# Patient Record
Sex: Male | Born: 1960 | Race: Black or African American | Hispanic: No | Marital: Married | Smoking: Never smoker
Health system: Southern US, Community
[De-identification: ages and names within clinical notes are randomized; demographics above are authoritative.]

## PROBLEM LIST (undated history)

## (undated) DIAGNOSIS — I10 Essential (primary) hypertension: Secondary | ICD-10-CM

## (undated) DIAGNOSIS — E785 Hyperlipidemia, unspecified: Secondary | ICD-10-CM

## (undated) DIAGNOSIS — D860 Sarcoidosis of lung: Secondary | ICD-10-CM

## (undated) HISTORY — DX: Hyperlipidemia, unspecified: E78.5

## (undated) HISTORY — DX: Sarcoidosis of lung: D86.0

## (undated) HISTORY — DX: Essential (primary) hypertension: I10

## (undated) HISTORY — PX: BRONCHOSCOPY: SUR163

---

## 2002-12-29 ENCOUNTER — Encounter: Payer: Self-pay | Admitting: Internal Medicine

## 2004-02-04 ENCOUNTER — Encounter: Admission: RE | Admit: 2004-02-04 | Discharge: 2004-05-04 | Payer: Self-pay | Admitting: Internal Medicine

## 2005-05-18 ENCOUNTER — Ambulatory Visit: Payer: Self-pay | Admitting: Internal Medicine

## 2005-05-24 ENCOUNTER — Ambulatory Visit: Payer: Self-pay | Admitting: Internal Medicine

## 2005-09-07 ENCOUNTER — Encounter: Admission: RE | Admit: 2005-09-07 | Discharge: 2005-09-07 | Payer: Self-pay | Admitting: Internal Medicine

## 2005-09-07 ENCOUNTER — Encounter: Payer: Self-pay | Admitting: Pulmonary Disease

## 2005-09-07 ENCOUNTER — Ambulatory Visit: Payer: Self-pay | Admitting: Internal Medicine

## 2005-09-10 ENCOUNTER — Ambulatory Visit: Payer: Self-pay | Admitting: Pulmonary Disease

## 2005-09-19 ENCOUNTER — Encounter (INDEPENDENT_AMBULATORY_CARE_PROVIDER_SITE_OTHER): Payer: Self-pay | Admitting: Specialist

## 2005-09-19 ENCOUNTER — Ambulatory Visit: Admission: RE | Admit: 2005-09-19 | Discharge: 2005-09-19 | Payer: Self-pay | Admitting: Pulmonary Disease

## 2005-09-19 ENCOUNTER — Ambulatory Visit: Payer: Self-pay | Admitting: Pulmonary Disease

## 2005-09-24 ENCOUNTER — Ambulatory Visit: Payer: Self-pay | Admitting: Pulmonary Disease

## 2005-09-25 ENCOUNTER — Emergency Department (HOSPITAL_COMMUNITY): Admission: EM | Admit: 2005-09-25 | Discharge: 2005-09-26 | Payer: Self-pay | Admitting: Emergency Medicine

## 2005-10-24 ENCOUNTER — Ambulatory Visit: Payer: Self-pay | Admitting: Pulmonary Disease

## 2006-07-26 ENCOUNTER — Ambulatory Visit: Payer: Self-pay | Admitting: Internal Medicine

## 2006-09-25 ENCOUNTER — Ambulatory Visit: Payer: Self-pay | Admitting: Internal Medicine

## 2006-09-25 LAB — CONVERTED CEMR LAB
AST: 24 units/L (ref 0–37)
Alkaline Phosphatase: 83 units/L (ref 39–117)
Bilirubin, Direct: 0.1 mg/dL (ref 0.0–0.3)
CO2: 27 meq/L (ref 19–32)
Calcium: 9.3 mg/dL (ref 8.4–10.5)
Chloride: 107 meq/L (ref 96–112)
Cholesterol: 232 mg/dL (ref 0–200)
Creatinine, Ser: 1.1 mg/dL (ref 0.4–1.5)
Eosinophils Relative: 2.7 % (ref 0.0–5.0)
Glucose, Bld: 88 mg/dL (ref 70–99)
HCT: 43.2 % (ref 39.0–52.0)
HDL: 39 mg/dL (ref >39.0)
MCV: 79.7 fL (ref 78.0–100.0)
Monocytes Absolute: 0.5 10*3/uL (ref 0.2–0.7)
Monocytes Relative: 13.5 % — ABNORMAL HIGH (ref 3.0–11.0)
Neutro Abs: 2.5 10*3/uL (ref 1.4–7.7)
Neutrophils Relative %: 64.3 % (ref 43.0–77.0)
Potassium: 4 meq/L (ref 3.5–5.1)
Sodium: 142 meq/L (ref 135–145)
Total Protein: 8.2 g/dL (ref 6.0–8.3)
Triglycerides: 78 mg/dL (ref 0–149)
VLDL: 16 mg/dL (ref 0–40)

## 2006-10-01 ENCOUNTER — Ambulatory Visit: Payer: Self-pay | Admitting: Internal Medicine

## 2007-03-08 DIAGNOSIS — D869 Sarcoidosis, unspecified: Secondary | ICD-10-CM

## 2007-03-08 DIAGNOSIS — I10 Essential (primary) hypertension: Secondary | ICD-10-CM | POA: Insufficient documentation

## 2007-03-08 DIAGNOSIS — E785 Hyperlipidemia, unspecified: Secondary | ICD-10-CM | POA: Insufficient documentation

## 2007-04-22 ENCOUNTER — Ambulatory Visit: Payer: Self-pay | Admitting: Internal Medicine

## 2007-05-06 ENCOUNTER — Ambulatory Visit: Payer: Self-pay | Admitting: Internal Medicine

## 2007-05-06 ENCOUNTER — Telehealth: Payer: Self-pay | Admitting: *Deleted

## 2007-05-06 LAB — CONVERTED CEMR LAB
Basophils Absolute: 0 10*3/uL (ref 0.0–0.1)
Cholesterol, target level: 200 mg/dL
Direct LDL: 203 mg/dL
Eosinophils Absolute: 0.1 10*3/uL (ref 0.0–0.6)
HDL goal, serum: 40 mg/dL
HDL: 40.9 mg/dL (ref >39.0)
Hemoglobin: 15.2 g/dL (ref 13.0–17.0)
LDL Goal: 130 mg/dL
MCV: 83.6 fL (ref 78.0–100.0)
Monocytes Relative: 16.5 % — ABNORMAL HIGH (ref 3.0–11.0)
Neutro Abs: 2.2 10*3/uL (ref 1.4–7.7)
Platelets: 236 10*3/uL (ref 150–400)
RBC: 5.42 M/uL (ref 4.22–5.81)
RDW: 12.9 % (ref 11.5–14.6)
WBC: 3.8 10*3/uL — ABNORMAL LOW (ref 4.5–10.5)

## 2007-05-26 IMAGING — CT CT CHEST W/ CM
2 of 5 series · 15 of 31 positions shown, 18 images · IV contrast (75CC OMNI 300)
Comparison: AO-MA4 Chest x-ray, 09/07/05

CLINICAL DATA: Recurrent chronic cough. 
CT CHEST WITH CONTRAST:
TECHNIQUE: Multidetector CT imaging of the chest was performed following the standard protocol during bolus administration of intravenous contrast.
Contrast:  75 cc Omnipaque 300

[Series 4: recon 3: routine chest · axial · 0.70mm/px · z∈[-260,-11]mm · 11 of 245 slices shown, 14 images]
[im 23/245  mediastinal]
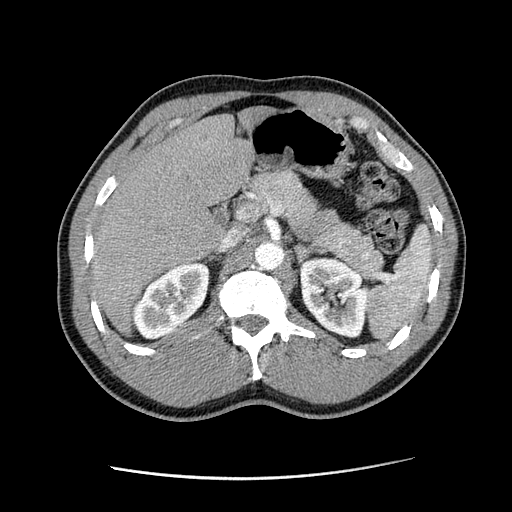
[im 23/245  lung]
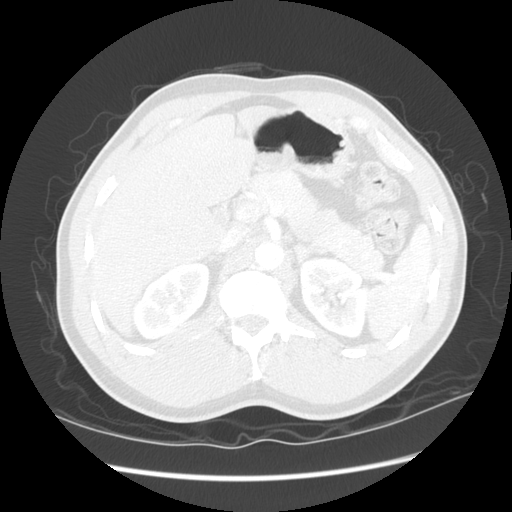
[im 45/245  lung]
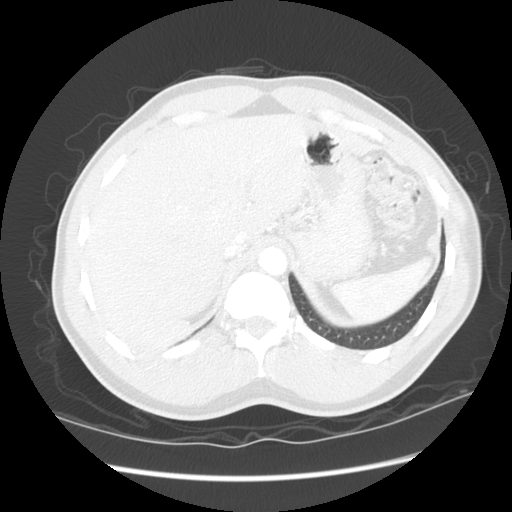
[im 67/245  lung]
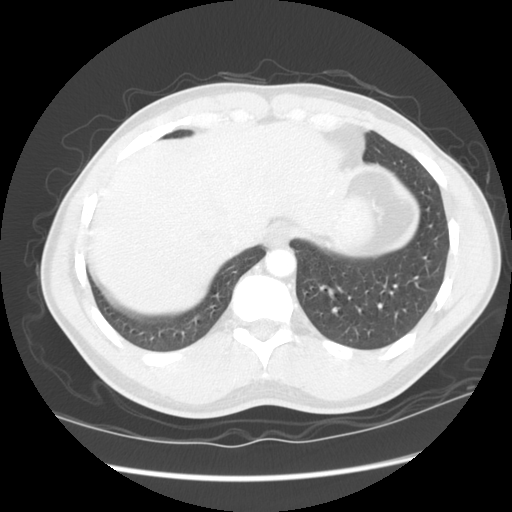
[im 89/245  lung]
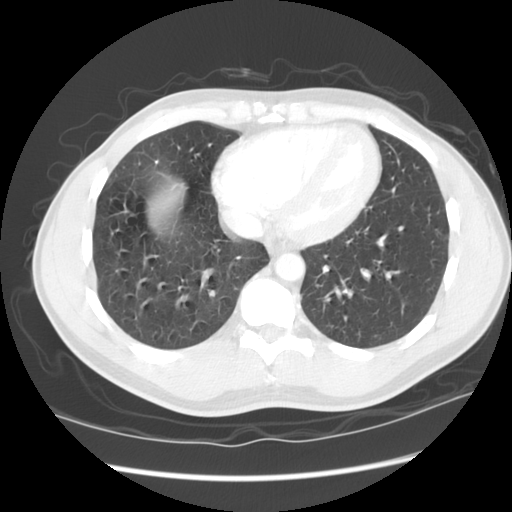
[im 111/245  mediastinal]
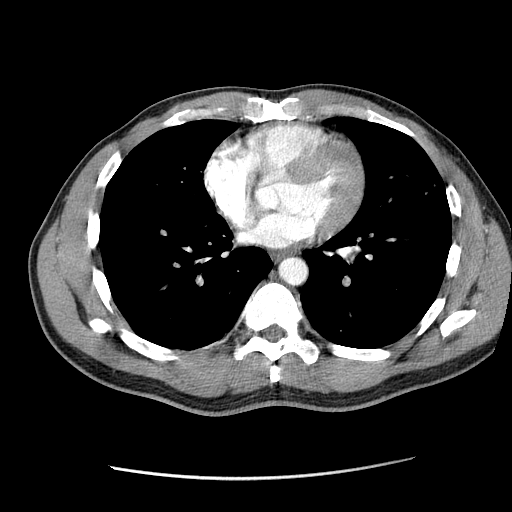
[im 111/245  lung]
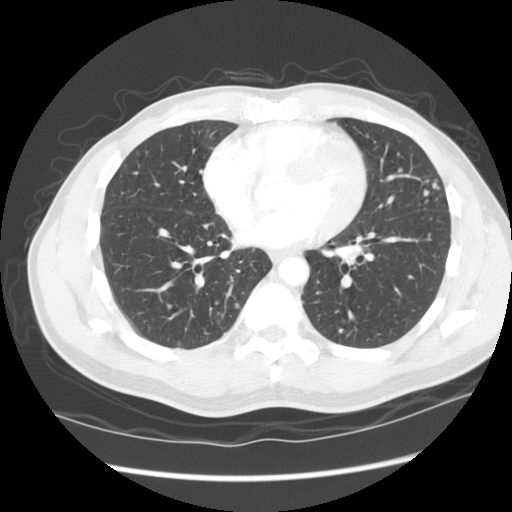
[im 126/245  lung]
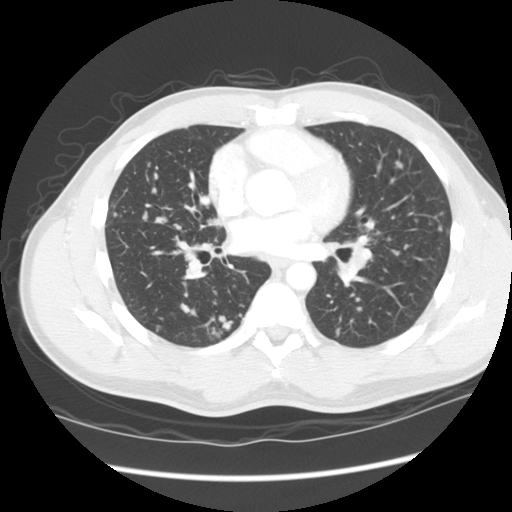
[im 134/245  lung]
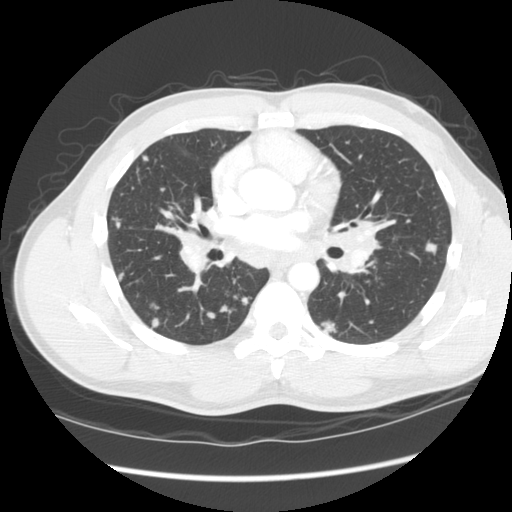
[im 156/245  lung]
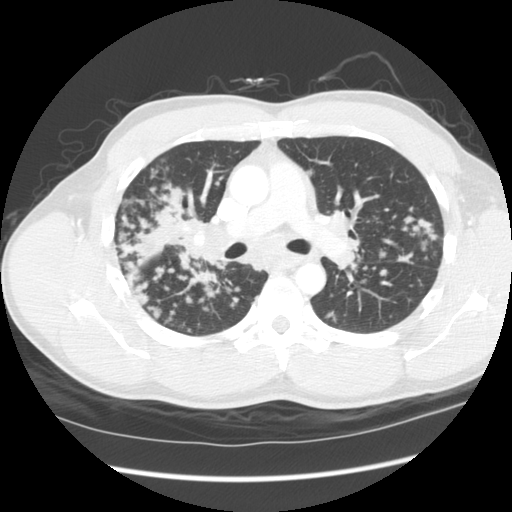
[im 178/245  mediastinal]
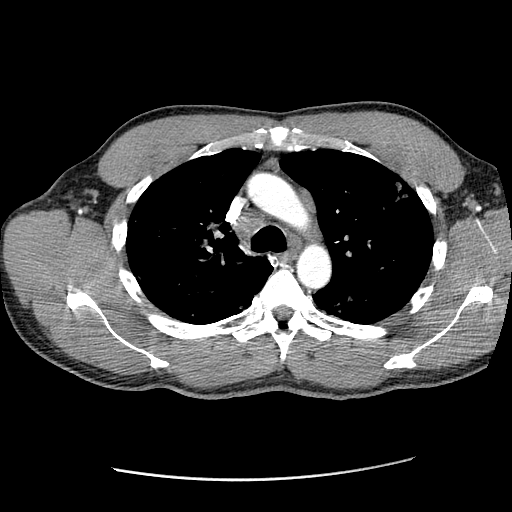
[im 178/245  lung]
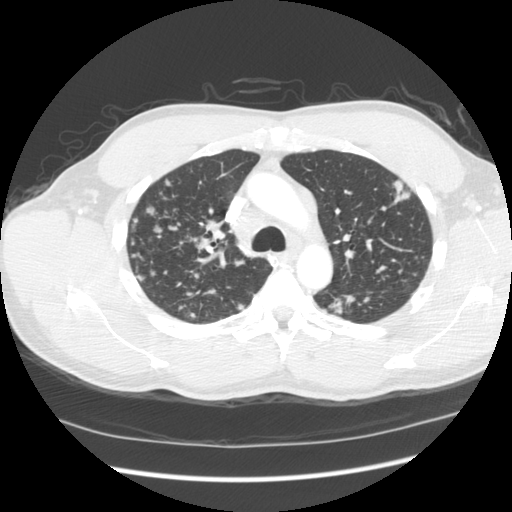
[im 200/245  lung]
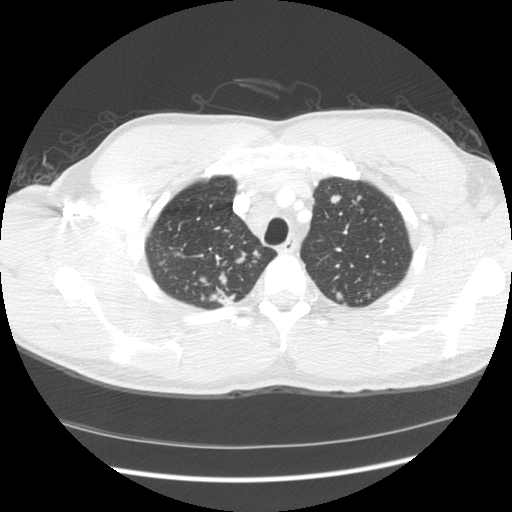
[im 222/245  lung]
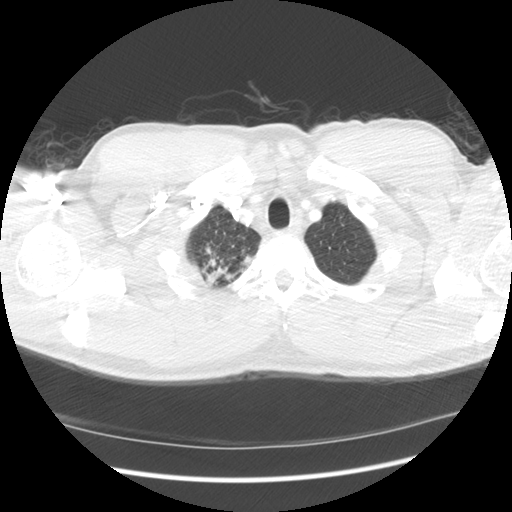

[Series 400: reformatted · sagittal · 0.70mm/px · 4 of 128 slices shown]
[im 26/128  lung]
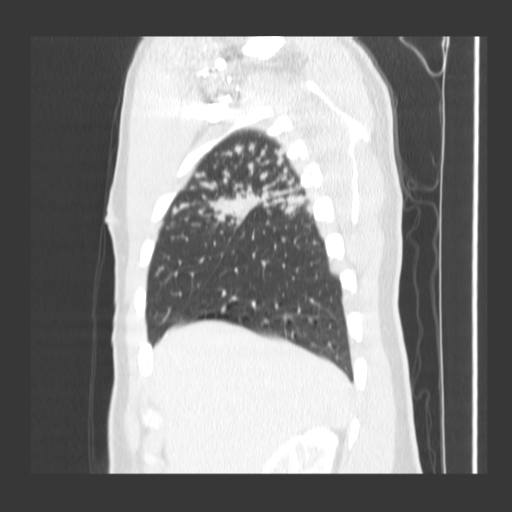
[im 51/128  lung]
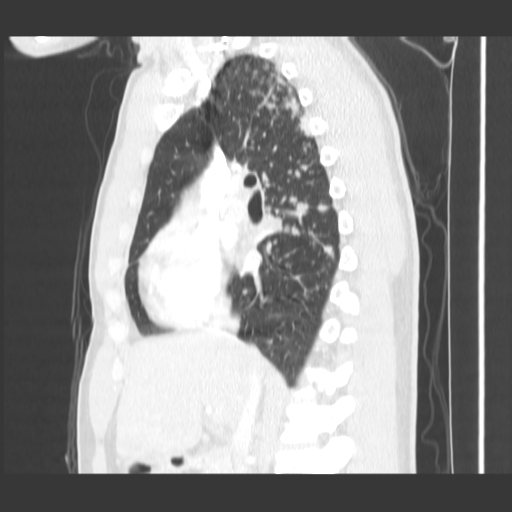
[im 77/128  lung]
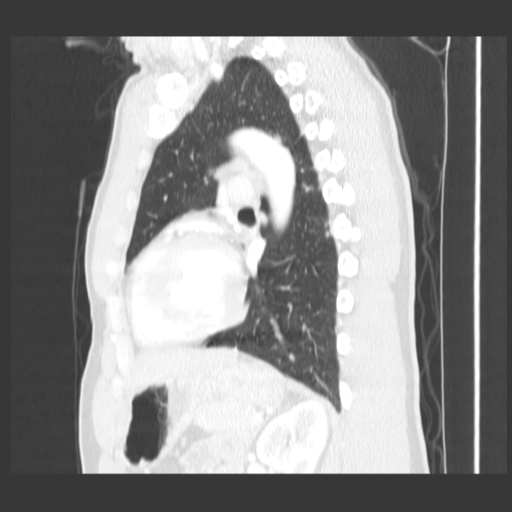
[im 102/128  lung]
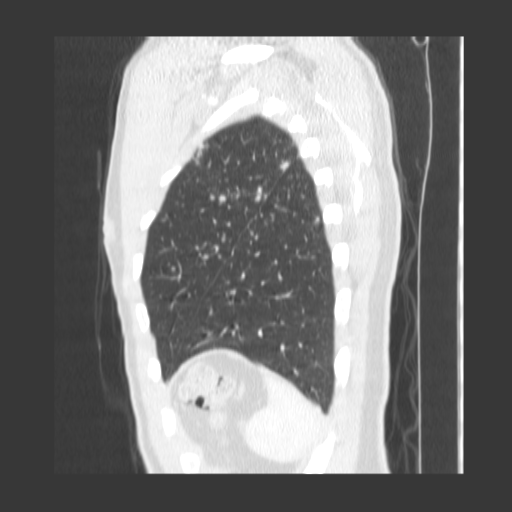

[15 of 31 positions shown; findings below may reference images not displayed]

FINDINGS: Slight to moderate right and slight left hilar adenopathy is seen with the largest node at the right hilum measuring 2.2 x 1.8 cm (image 23) and the largest left hilar lymph node measuring approximately 1.2 cm AP x 2.2 cm wide (image 29).  Moderate mediastinal adenopathy is seen from the superior, left para-aortic and largest nodal focus at the subcarinal region (latter measuring 2.6 cm AP x 3.9 cm wide on image 25).  No significant axillary nor supraclavicular adenopathy is seen.  Incidental 7 mm inferior left thyroid low density nodule probably represents incidental adenoma.  Heart size is normal.  No change in noncalcified multiple pulmonary nodules primarily in the right greater than left upper lobe distribution and scattered involvement superior segments bilateral lower lobe and right middle lobe.  The nodules are coalescent at the right perihilar upper lobe region.  The lungs are otherwise clear.  Slight diffuse fatty infiltration of the liver is seen with upper abdominal organs and osseous structures otherwise normal appearing.
IMPRESSION: 1.  Moderate mediastinal with lesser bilateral hilar adenopathy with multiple primarily upper lobe pulmonary nodules.  Differential diagnosis again includes sarcoidosis, infectious/inflammatory etiology such as fungus, Ariclenis?Sandhu, and less likely lymphoproliferative and metastatic disease.  Tissue sampling would be most specific.
2.  Slight diffuse fatty infiltration of the liver.
3.  Otherwise negative.

## 2007-05-26 IMAGING — CR DG CHEST 2V
2 series · 2 of 2 positions shown · non-contrast
Comparison: None.

CLINICAL DATA: Chronic cough for two weeks.
CHEST, TWO VIEWS:

[view not recorded (1 of 2)]
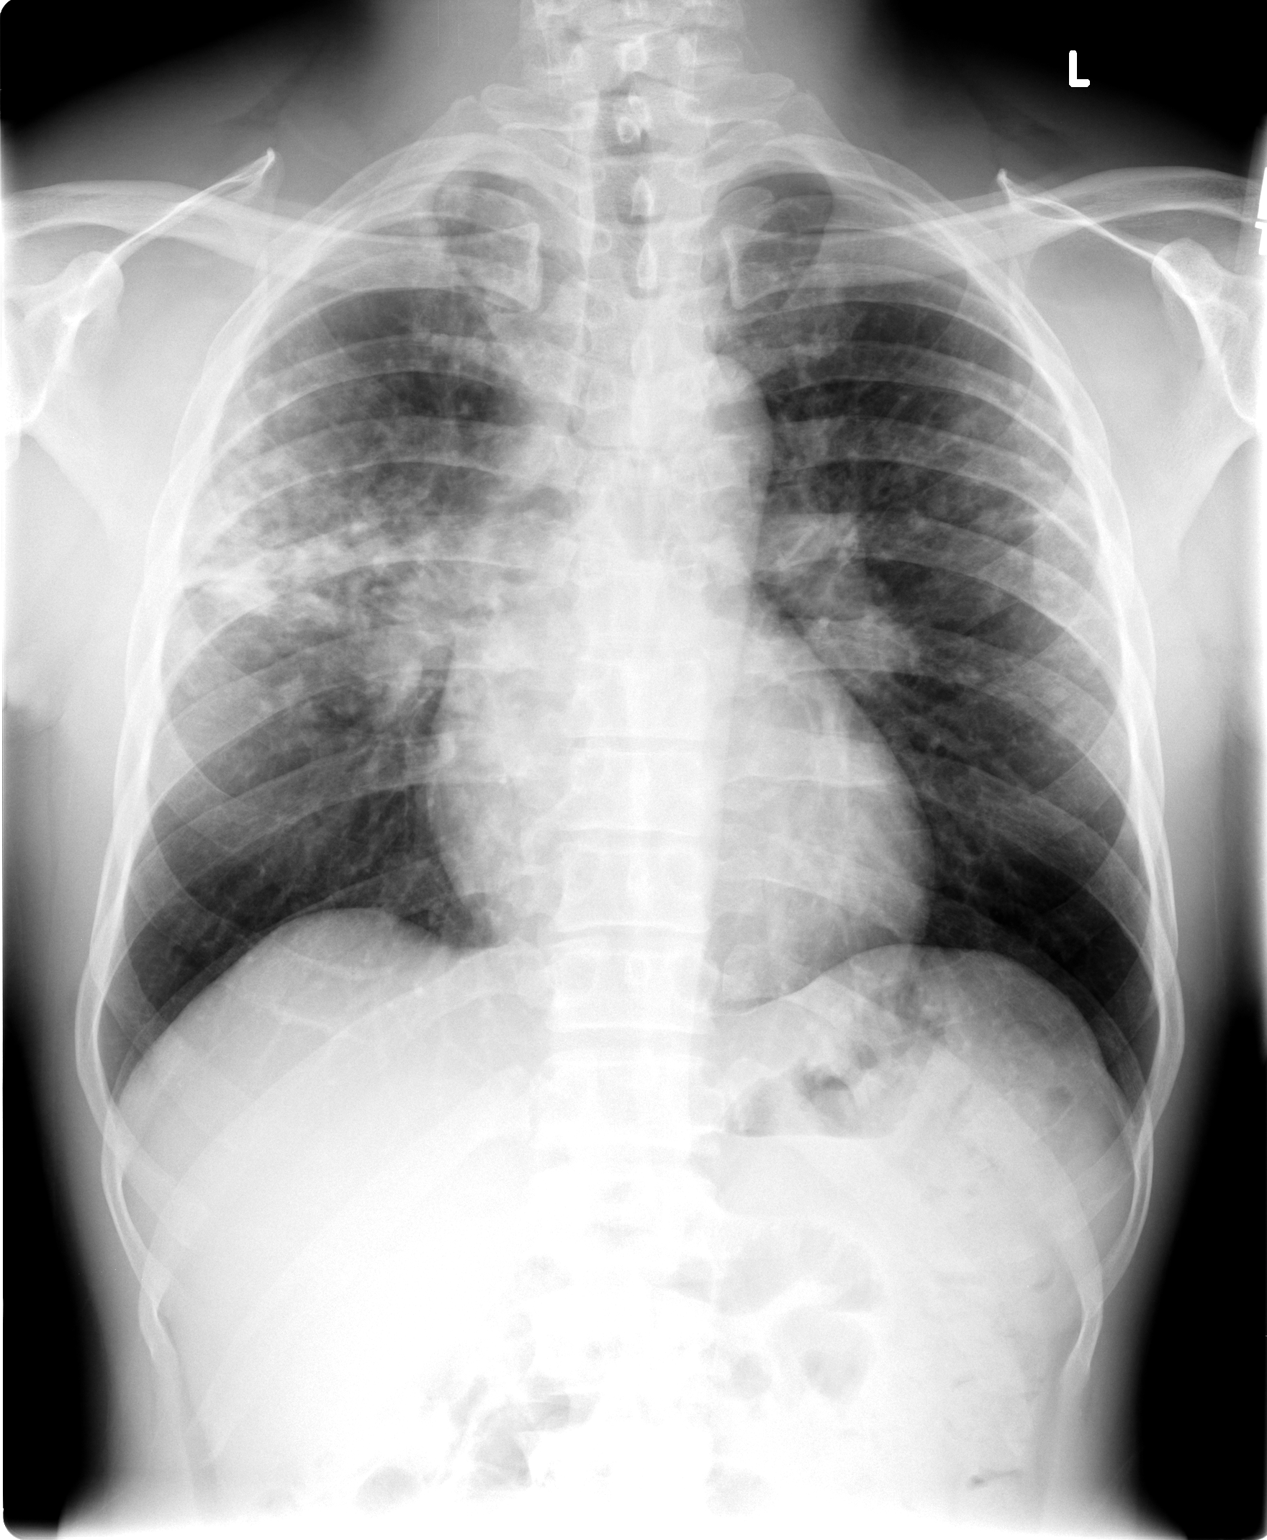

[view not recorded (2 of 2)]
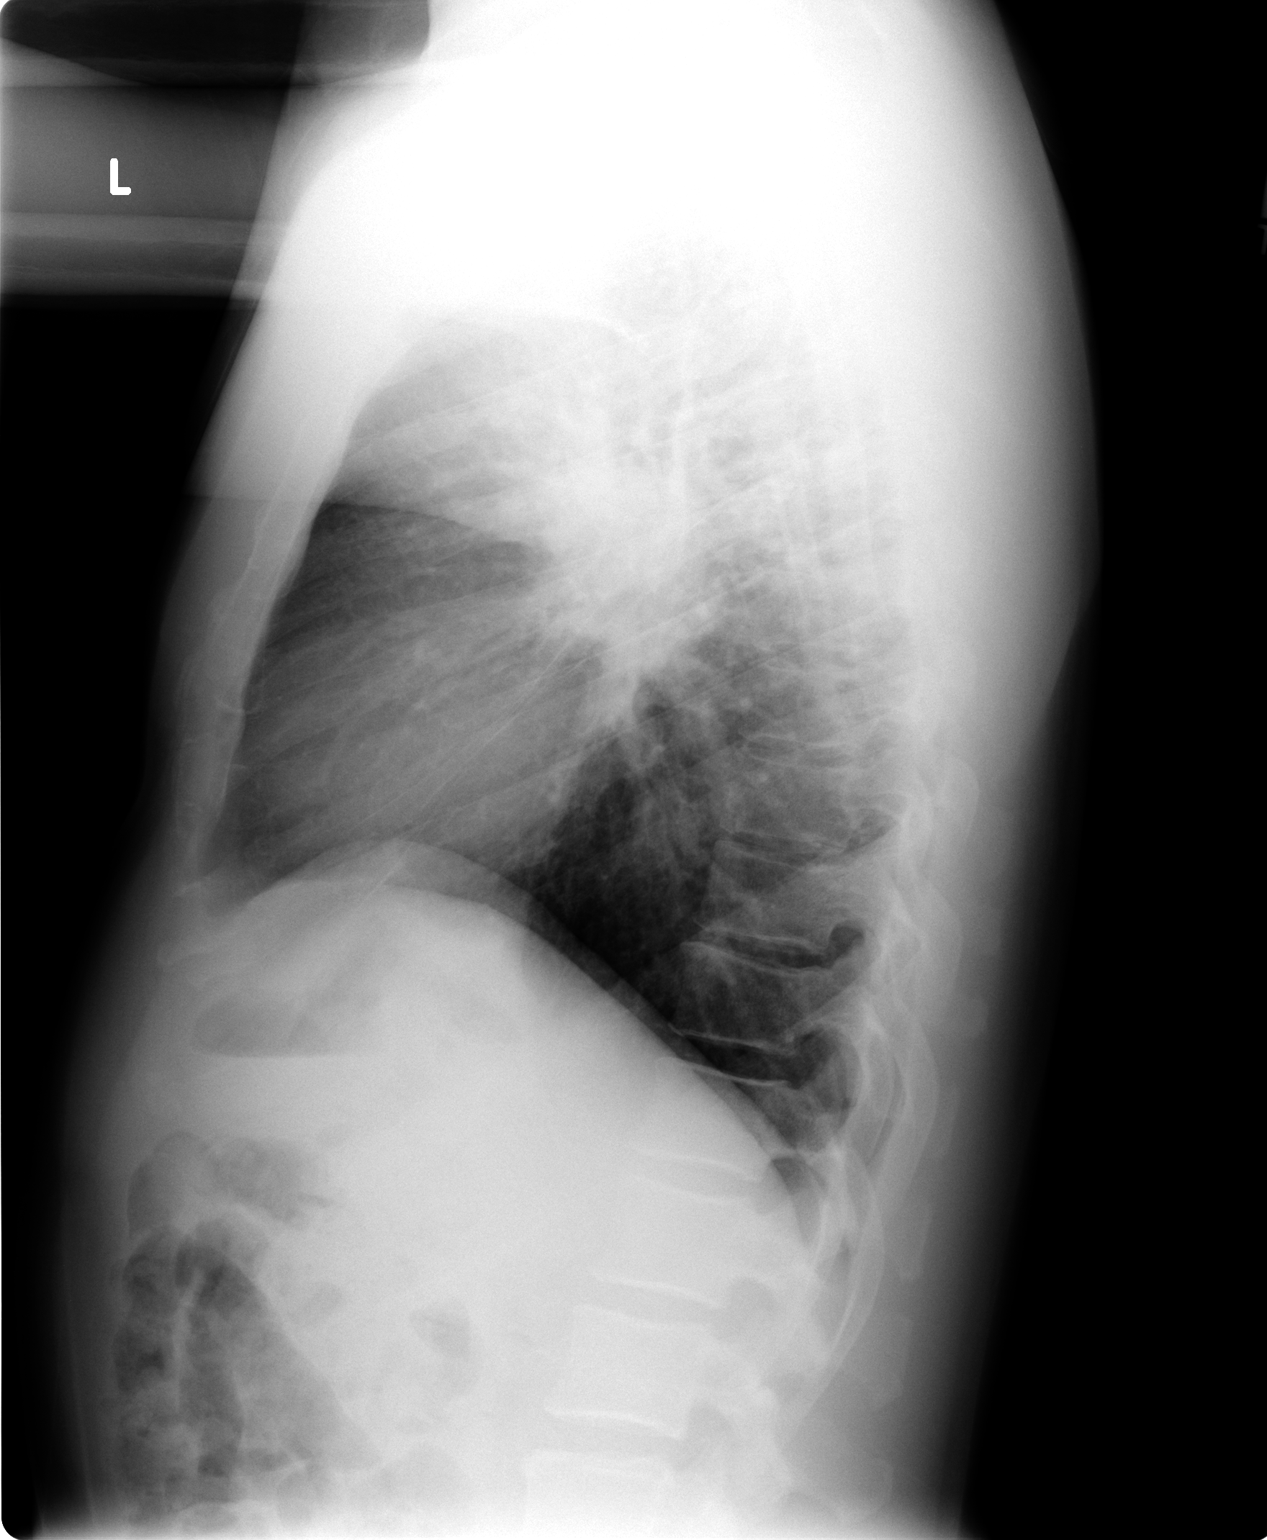

[2 of 2 positions shown; findings below may reference images not displayed]

Extensive nodular densities are  seen in the upper lobes bilaterally.  Lower lobes are relatively spared.  There is question of hilar adenopathy and right paratracheal adenopathy. There is no effusion.  Heart is not enlarged.
IMPRESSION: Multiple pulmonary nodules bilaterally.  Question hilar and mediastinal adenopathy. 
Sarcoid is a possibility.  Metastatic disease is also in the differential, as is chronic lung disease and possibly acute respiratory infection.  CT scanning may be helpful for further evaluation.

## 2007-06-26 ENCOUNTER — Ambulatory Visit: Payer: Self-pay | Admitting: Internal Medicine

## 2007-06-26 LAB — CONVERTED CEMR LAB
ALT: 29 units/L (ref 0–53)
AST: 26 units/L (ref 0–37)
Cholesterol: 166 mg/dL (ref 0–200)
GFR calc Af Amer: 83 mL/min
GFR calc non Af Amer: 69 mL/min
Glucose, Bld: 86 mg/dL (ref 70–99)
HDL: 36.1 mg/dL — ABNORMAL LOW (ref >39.0)
Potassium: 4.4 meq/L (ref 3.5–5.1)
Sodium: 141 meq/L (ref 135–145)
Total CHOL/HDL Ratio: 4.6
Triglycerides: 54 mg/dL (ref 0–149)

## 2007-07-02 ENCOUNTER — Ambulatory Visit: Payer: Self-pay | Admitting: Internal Medicine

## 2007-08-19 ENCOUNTER — Ambulatory Visit: Payer: Self-pay | Admitting: Pulmonary Disease

## 2007-09-02 ENCOUNTER — Telehealth (INDEPENDENT_AMBULATORY_CARE_PROVIDER_SITE_OTHER): Payer: Self-pay | Admitting: *Deleted

## 2007-09-30 ENCOUNTER — Ambulatory Visit: Payer: Self-pay | Admitting: Pulmonary Disease

## 2007-10-01 ENCOUNTER — Encounter: Payer: Self-pay | Admitting: Pulmonary Disease

## 2007-10-02 ENCOUNTER — Telehealth (INDEPENDENT_AMBULATORY_CARE_PROVIDER_SITE_OTHER): Payer: Self-pay | Admitting: *Deleted

## 2007-10-24 ENCOUNTER — Telehealth: Payer: Self-pay | Admitting: Pulmonary Disease

## 2007-10-24 ENCOUNTER — Telehealth: Payer: Self-pay | Admitting: Internal Medicine

## 2007-10-26 ENCOUNTER — Encounter: Payer: Self-pay | Admitting: Internal Medicine

## 2008-03-09 ENCOUNTER — Telehealth: Payer: Self-pay | Admitting: *Deleted

## 2008-06-22 ENCOUNTER — Ambulatory Visit: Payer: Self-pay | Admitting: Internal Medicine

## 2008-06-22 LAB — CONVERTED CEMR LAB
Bilirubin Urine: NEGATIVE
Glucose, Urine, Semiquant: NEGATIVE
Ketones, urine, test strip: NEGATIVE
WBC Urine, dipstick: NEGATIVE
pH: 5.5

## 2008-06-23 ENCOUNTER — Encounter: Payer: Self-pay | Admitting: Internal Medicine

## 2008-06-23 LAB — CONVERTED CEMR LAB
Albumin: 4.4 g/dL (ref 3.5–5.2)
Alkaline Phosphatase: 58 units/L (ref 39–117)
Basophils Absolute: 0 10*3/uL (ref 0.0–0.1)
CO2: 31 meq/L (ref 19–32)
Calcium: 9.6 mg/dL (ref 8.4–10.5)
Chloride: 104 meq/L (ref 96–112)
Creatinine, Ser: 1.1 mg/dL (ref 0.4–1.5)
Eosinophils Absolute: 0.1 10*3/uL (ref 0.0–0.7)
GFR calc non Af Amer: 76 mL/min
Glucose, Bld: 100 mg/dL — ABNORMAL HIGH (ref 70–99)
HCT: 44.2 % (ref 39.0–52.0)
Lymphocytes Relative: 25.5 % (ref 12.0–46.0)
MCHC: 34.1 g/dL (ref 30.0–36.0)
MCV: 83.8 fL (ref 78.0–100.0)
Neutro Abs: 2.2 10*3/uL (ref 1.4–7.7)
Neutrophils Relative %: 56.3 % (ref 43.0–77.0)
Platelets: 165 10*3/uL (ref 150–400)
RBC: 5.28 M/uL (ref 4.22–5.81)
TSH: 0.5 microintl units/mL (ref 0.35–5.50)
Total Protein: 8 g/dL (ref 6.0–8.3)
Triglycerides: 60 mg/dL (ref 0–149)

## 2008-07-02 ENCOUNTER — Telehealth: Payer: Self-pay | Admitting: Internal Medicine

## 2009-01-10 ENCOUNTER — Ambulatory Visit: Payer: Self-pay | Admitting: Pulmonary Disease

## 2009-01-19 ENCOUNTER — Ambulatory Visit: Payer: Self-pay | Admitting: Pulmonary Disease

## 2009-03-15 ENCOUNTER — Ambulatory Visit: Payer: Self-pay | Admitting: Internal Medicine

## 2009-03-15 LAB — CONVERTED CEMR LAB
Bilirubin Urine: NEGATIVE
Glucose, Urine, Semiquant: NEGATIVE
Ketones, urine, test strip: NEGATIVE
Nitrite: NEGATIVE
Protein, U semiquant: NEGATIVE

## 2009-05-12 ENCOUNTER — Telehealth: Payer: Self-pay | Admitting: *Deleted

## 2009-06-13 ENCOUNTER — Telehealth (INDEPENDENT_AMBULATORY_CARE_PROVIDER_SITE_OTHER): Payer: Self-pay | Admitting: *Deleted

## 2009-10-26 ENCOUNTER — Encounter: Payer: Self-pay | Admitting: *Deleted

## 2009-11-15 ENCOUNTER — Telehealth: Payer: Self-pay | Admitting: *Deleted

## 2009-11-15 ENCOUNTER — Ambulatory Visit: Payer: Self-pay | Admitting: Internal Medicine

## 2009-11-22 LAB — CONVERTED CEMR LAB
ALT: 51 units/L (ref 0–53)
Albumin: 4.6 g/dL (ref 3.5–5.2)
CO2: 30 meq/L (ref 19–32)
Calcium: 9.6 mg/dL (ref 8.4–10.5)
Chloride: 101 meq/L (ref 96–112)
Creatinine, Ser: 1.1 mg/dL (ref 0.4–1.5)
Eosinophils Relative: 3.5 % (ref 0.0–5.0)
GFR calc non Af Amer: 94.21 mL/min (ref >60)
HDL: 47.1 mg/dL (ref >39.00)
LDL Cholesterol: 103 mg/dL — ABNORMAL HIGH (ref 0–99)
Lymphocytes Relative: 25.8 % (ref 12.0–46.0)
MCV: 84.7 fL (ref 78.0–100.0)
Monocytes Absolute: 0.6 10*3/uL (ref 0.1–1.0)
Monocytes Relative: 15.1 % — ABNORMAL HIGH (ref 3.0–12.0)
Neutro Abs: 2.3 10*3/uL (ref 1.4–7.7)
Potassium: 4.1 meq/L (ref 3.5–5.1)
RDW: 13.1 % (ref 11.5–14.6)
Total Bilirubin: 0.8 mg/dL (ref 0.3–1.2)
Total CHOL/HDL Ratio: 3
Triglycerides: 53 mg/dL (ref 0.0–149.0)
VLDL: 10.6 mg/dL (ref 0.0–40.0)

## 2010-01-20 ENCOUNTER — Telehealth: Payer: Self-pay | Admitting: *Deleted

## 2010-02-15 ENCOUNTER — Encounter (INDEPENDENT_AMBULATORY_CARE_PROVIDER_SITE_OTHER): Payer: Self-pay | Admitting: *Deleted

## 2010-03-06 ENCOUNTER — Encounter (INDEPENDENT_AMBULATORY_CARE_PROVIDER_SITE_OTHER): Payer: Self-pay | Admitting: *Deleted

## 2010-03-08 ENCOUNTER — Ambulatory Visit: Payer: Self-pay | Admitting: Gastroenterology

## 2010-03-21 ENCOUNTER — Telehealth: Payer: Self-pay | Admitting: Gastroenterology

## 2010-04-06 ENCOUNTER — Telehealth: Payer: Self-pay | Admitting: Gastroenterology

## 2010-05-11 ENCOUNTER — Telehealth: Payer: Self-pay | Admitting: Gastroenterology

## 2010-06-27 ENCOUNTER — Telehealth: Payer: Self-pay | Admitting: Gastroenterology

## 2010-06-28 ENCOUNTER — Ambulatory Visit: Admit: 2010-06-28 | Payer: Self-pay | Admitting: Gastroenterology

## 2010-07-12 NOTE — Letter (Signed)
Summary: [facility] Instructions  [facility]  [address] [place], [place] [address]   Phone: [phone]  Fax: [phone]       Tyler Barrera    07/22/1960    MRN: [id number]        Procedure Day Dorna Bloom: Wednesday 03-22-10     Arrival Time: 8:00 a.m.      Procedure Time: 9:00 a.m.     Location of Procedure:                    _x _  [facility] (4th Floor)                        PREPARATION FOR COLONOSCOPY WITH MOVIPREP   Starting 5 days prior to your procedure 03-17-10 do not eat nuts, seeds, popcorn, corn, beans, peas,  salads, or any raw vegetables.  Do not take any fiber supplements (e.g. Metamucil, Citrucel, and Benefiber).  THE DAY BEFORE YOUR PROCEDURE         DATE:  03-21-10  DAY: Tuesday  1.  Drink clear liquids the entire day-NO SOLID FOOD  2.  Do not drink anything colored red or purple.  Avoid juices with pulp.  No orange juice.  3.  Drink at least 64 oz. (8 glasses) of fluid/clear liquids during the day to prevent dehydration and help the prep work efficiently.  CLEAR LIQUIDS INCLUDE: Water Jello Ice Popsicles Tea (sugar ok, no milk/cream) Powdered fruit flavored drinks Coffee (sugar ok, no milk/cream) Gatorade Juice: apple, white grape, white cranberry  Lemonade Clear bullion, consomm, broth Carbonated beverages (any kind) Strained chicken noodle soup Hard Candy                             4.  In the morning, mix first dose of MoviPrep solution:    Empty 1 Pouch A and 1 Pouch B into the disposable container    Add lukewarm drinking water to the top line of the container. Mix to dissolve    Refrigerate (mixed solution should be used within 24 hrs)  5.  Begin drinking the prep at 5:00 p.m. The MoviPrep container is divided by 4 marks.   Every 15 minutes drink the solution down to the next mark (approximately 8 oz) until the full liter is complete.   6.  Follow completed prep with 16 oz of clear liquid of your choice  (Nothing red or purple).  Continue to drink clear liquids until bedtime.  7.  Before going to bed, mix second dose of MoviPrep solution:    Empty 1 Pouch A and 1 Pouch B into the disposable container    Add lukewarm drinking water to the top line of the container. Mix to dissolve    Refrigerate  THE DAY OF YOUR PROCEDURE      DATE:  03-22-10  DAY: Wednesday  Beginning at  4:00 a.m. (5 hours before procedure):         1. Every 15 minutes, drink the solution down to the next mark (approx 8 oz) until the full liter is complete.  2. Follow completed prep with 16 oz. of clear liquid of your choice.    3. You may drink clear liquids until  7:00 a.m.  (2 HOURS BEFORE PROCEDURE).   MEDICATION INSTRUCTIONS  Unless otherwise instructed, you should take regular prescription medications with a small sip of water  as early as possible the morning of your procedure.         OTHER INSTRUCTIONS  You will need a responsible adult at least 51 years of age to accompany you and drive you home.   This person must remain in the waiting room during your procedure.  Wear loose fitting clothing that is easily removed.  Leave jewelry and other valuables at home.  However, you may wish to bring a book to read or  an iPod/MP3 player to listen to music as you wait for your procedure to start.  Remove all body piercing jewelry and leave at home.  Total time from sign-in until discharge is approximately 2-3 hours.  You should go home directly after your procedure and rest.  You can resume normal activities the  day after your procedure.  The day of your procedure you should not:   Drive   Make legal decisions   Operate machinery   Drink alcohol   Return to work  You will receive specific instructions about eating, activities and medications before you leave.    The above instructions have been reviewed and explained to me by   Wyona Almas RN  March 08, 2010 8:32 AM     I  fully understand and can verbalize these instructions _____________________________ Date _________

## 2010-07-12 NOTE — Progress Notes (Signed)
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 Request forwarded to [employer]. Dena Chavis  June 13, 2009 2:58 PM

## 2010-07-12 NOTE — Progress Notes (Signed)
Summary: Change pt's maxzide  Phone Note From Pharmacy   Caller: [facility]  [address]* Summary of Call: Maxzide 37.5/25mg  is on backorder. Can we change him to 75/50 mg ? Initial call taken by: Romualdo Bolk, CMA (AAMA),  November 15, 2009 5:07 PM  Follow-up for Phone Call        ok to do this and take 1/2  Follow-up by: Madelin Headings MD,  November 15, 2009 6:28 PM  Additional Follow-up for Phone Call Additional follow up Details #1::        Rx sent to pharmacy. LM for pt to call back. Additional Follow-up by: Romualdo Bolk, CMA (AAMA),  November 16, 2009 9:20 AM    Additional Follow-up for Phone Call Additional follow up Details #2::    LM to call back. Follow-up by: Romualdo Bolk, CMA Duncan Dull),  November 22, 2009 8:37 AM  Additional Follow-up for Phone Call Additional follow up Details #3:: Details for Additional Follow-up Action Taken: Pt aware  Additional Follow-up by: Romualdo Bolk, CMA (AAMA),  November 22, 2009 8:43 AM  New/Updated Medications: MAXZIDE 75-50 MG TABS (TRIAMTERENE-HCTZ) take 1/2 by mouth once daily Prescriptions: MAXZIDE 75-50 MG TABS (TRIAMTERENE-HCTZ) take 1/2 by mouth once daily  #15 x 5   Entered by:   Romualdo Bolk, CMA (AAMA)   Authorized by:   Madelin Headings MD   Signed by:   Romualdo Bolk, CMA (AAMA) on 11/16/2009   Method used:   Electronically to        [facility] [phone]* (retail)       [address]       [place], [place]  [address]       Ph: [phone] or [phone]       Fax: [phone]   RxID:   [id number]

## 2010-07-12 NOTE — Progress Notes (Signed)
Summary: Resch'd COL   Phone Note Call from Patient   Caller: Patient Call For: Dr. Christella Hartigan Summary of Call: pt. r/s his COL from 03-22-10 to 04-07-10 due to conflict w/work sch'd. Would you like pt. charged the cancelation fee? Initial call taken by: Karna Christmas,  March 21, 2010 8:19 AM  Follow-up for Phone Call        no charge Follow-up by: Rachael Fee MD,  March 21, 2010 8:31 AM  Additional Follow-up for Phone Call Additional follow up Details #1::        Patient NOT BILLED. Additional Follow-up by: Leanor Kail [facility],  March 22, 2010 4:21 PM

## 2010-07-12 NOTE — Miscellaneous (Signed)
Summary: [facility] Previsit/prep  Clinical Lists Changes  Medications: Added new medication of MOVIPREP 100 GM  SOLR (PEG-KCL-NACL-NASULF-NA ASC-C) As per prep instructions. - Signed Rx of MOVIPREP 100 GM  SOLR (PEG-KCL-NACL-NASULF-NA ASC-C) As per prep instructions.;  #1 x 0;  Signed;  Entered by: Wyona Almas RN;  Authorized by: Rachael Fee MD;  Method used: Electronically to [facility]*, [address] [place], [place], [place]  [address], Ph: [phone] or [phone], Fax: [phone] Observations: Added new observation of NKA: T (03/08/2010 8:16)    Prescriptions: MOVIPREP 100 GM  SOLR (PEG-KCL-NACL-NASULF-NA ASC-C) As per prep instructions.  #1 x 0   Entered by:   Wyona Almas RN   Authorized by:   Rachael Fee MD   Signed by:   Wyona Almas RN on 03/08/2010   Method used:   Electronically to        [facility] [address] [phone]* (retail)       [address]       [place], [place]  [address]       Ph: [phone] or [phone]       Fax: [phone]   RxID:   [id number]

## 2010-07-12 NOTE — Assessment & Plan Note (Signed)
Summary: fu per pt/njr   Vital Signs:  Patient profile:   50 year old male Height:      66 inches Weight:      166 pounds BMI:     26.89 Pulse rate:   83 / minute BP sitting:   130 / 90  (left arm) Cuff size:   regular  Vitals Entered By: Romualdo Bolk, CMA (AAMA) (November 15, 2009 8:45 AM)  Serial Vital Signs/Assessments:  Time      Position  BP       Pulse  Resp  Temp     By                     138/88                         Madelin Headings MD  CC: Follow-up visit on meds- Pt is fasting for labs, Hypertension Management   History of Present Illness: Tyler Barrera comes in today   for multiple medical problems .   He is late for his follow up visit and labs werer not done as planned  . HOwever  Since last visit  here  there have been no major changes in health status  .   And he states he is doing well. Sarcoid : coughing    to see dr Shelle Iron  every 6-8 months  . seems to be influenced by heat and cold .  Stable today   BP : doing well  taking meds  not checking readings much LIPIDs:  Taking meds ok .   Due for eye  doctor.   Hypertension History:      He denies headache, chest pain, palpitations, dyspnea with exertion, orthopnea, PND, peripheral edema, visual symptoms, neurologic problems, syncope, and side effects from treatment.  He notes no problems with any antihypertensive medication side effects.        Positive major cardiovascular risk factors include male age 68 years old or older, hyperlipidemia, and hypertension.  Negative major cardiovascular risk factors include no history of diabetes and non-tobacco-user status.        Further assessment for target organ damage reveals no history of ASHD, stroke/TIA, or peripheral vascular disease.     Preventive Screening-Counseling & Management  Alcohol-Tobacco     Alcohol drinks/day: <1     Alcohol type: wine     Smoking Status: never  Caffeine-Diet-Exercise     Caffeine use/day: 2-3 a week     Does Patient Exercise:  yes     Type of exercise: basketball, wts, tennis     Times/week: 2  Hep-HIV-STD-Contraception     Dental Visit-last 6 months yes     Sun Exposure-Excessive: no  Safety-Violence-Falls     Seat Belt Use: yes     Firearms in the Home: no firearms in the home     Smoke Detectors: yes  Current Medications (verified): 1)  Maxzide-25 37.5-25 Mg Tabs (Triamterene-Hctz) .... Take 1 Tablet By Mouth Once A Day 2)  Zocor 40 Mg  Tabs (Simvastatin) .Marland Kitchen.. 1 By Mouth Once Daily  Allergies (verified): No Known Drug Allergies  Past History:  Past medical, surgical, family and social histories (including risk factors) reviewed, and no changes noted (except as noted below).  Past Medical History: Reviewed history from 06/22/2008 and no changes required. 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 Shelle Iron  Past Surgical History: Reviewed history from 03/08/2007 and no changes required. bronchoscopy  04/67  Past History:  Care Management: Pulmonary: [place]  Family History: Reviewed history from 06/22/2008 and no changes required. Family History Hypertension mom Family History of Prostate CA 1st degree relative  father Father:  HBP, high cholesterol Mother:  HBP, High cholesterol Siblings: no    Social History: Reviewed history from 03/15/2009 and no changes required. Married Never Smoked Alcohol use-yes Drug use-no Regular exercise-yes HH of 3    no pets    [facility] plus   Sleep  7 hours  working    Review of Systems  The patient denies anorexia, fever, weight loss, weight gain, vision loss, decreased hearing, hoarseness, chest pain, syncope, peripheral edema, headaches, hemoptysis, abdominal pain, melena, hematochezia, severe indigestion/heartburn, hematuria, incontinence, muscle  weakness, suspicious skin lesions, transient blindness, difficulty walking, depression, unusual weight change, abnormal bleeding, enlarged lymph nodes, angioedema, and testicular masses.    Physical Exam  General:  Well-developed,well-nourished,in no acute distress; alert,appropriate and cooperative throughout examination Head:  normocephalic and atraumatic.   Eyes:  vision grossly intact, pupils equal, and pupils round.   Ears:  R ear normal and L ear normal.  no external deformities.   Nose:  no external deformity, no external erythema, and no nasal discharge.   Mouth:  pharynx pink and moist.   Neck:  No deformities, masses, or tenderness noted. Lungs:  Normal respiratory effort, chest expands symmetrically. Lungs are clear to auscultation, no crackles or wheezes.no dullness.   Heart:  Normal rate and regular rhythm. S1 and S2 normal without gallop, murmur, click, rub or other extra sounds.no lifts.   Abdomen:  Bowel sounds positive,abdomen soft and non-tender without masses, organomegaly or  noted. Pulses:  pulses intact without delay   Extremities:  no clubbing cyanosis or edema  Neurologic:  non focal  Skin:  turgor normal, color normal, no ecchymoses, and no petechiae.   Cervical Nodes:  No lymphadenopathy noted Psych:  memory intact for recent and remote, good eye contact, not anxious appearing, and not depressed appearing.     Impression & Recommendations:  Problem # 1:  HYPERTENSION (ICD-401.9)  borderline readings  need to ensure control  His updated medication list for this problem includes:    Maxzide-25 37.5-25 Mg Tabs (Triamterene-hctz) .Marland Kitchen... Take 1 tablet by mouth once a day  Orders: TLB-BMP (Basic Metabolic Panel-BMET) (80048-METABOL) Venipuncture (76283)  BP today: 130/90 Prior BP: 120/84 (03/15/2009)  10 Yr Risk Heart Disease: 11 % Prior 10 Yr Risk Heart Disease: 7 % (03/15/2009)  Labs Reviewed: K+: 4.2 (06/22/2008) Creat: : 1.1 (06/22/2008)   Chol: 162  (06/22/2008)   HDL: 43.7 (06/22/2008)   LDL: 106 (06/22/2008)   TG: 60 (06/22/2008)  Problem # 2:  HYPERLIPIDEMIA (ICD-272.4)  His updated medication list for this problem includes:    Zocor 40 Mg Tabs (Simvastatin) .Marland Kitchen... 1 by mouth once daily  Orders: TLB-BMP (Basic Metabolic Panel-BMET) (80048-METABOL) TLB-Hepatic/Liver Function Pnl (80076-HEPATIC) TLB-TSH (Thyroid Stimulating Hormone) (84443-TSH) TLB-Lipid Panel (80061-LIPID) Venipuncture (15176)  Labs Reviewed: SGOT: 28 (06/22/2008)   SGPT: 29 (06/22/2008)  Lipid Goals: Chol Goal: 200 (05/06/2007)   HDL Goal: 40 (05/06/2007)   LDL Goal: 130 (05/06/2007)   TG Goal: 150 (05/06/2007)  10 Yr Risk Heart Disease: 11 % Prior 10 Yr Risk Heart Disease: 7 % (03/15/2009)  HDL:43.7 (06/22/2008), 36.1 (06/26/2007)  LDL:106 (06/22/2008), 119 (06/26/2007)  Chol:162 (06/22/2008), 166 (06/26/2007)  Trig:60 (06/22/2008), 54 (06/26/2007)  Problem # 3:  PULMONARY SARCOIDOSIS (ICD-135)  Orders: TLB-CBC Platelet - w/Differential (85025-CBCD)  Problem # 4:  Preventive Health Care (ICD-V70.0) reviewed  wellness parameters and rec  colonosocpy  this year    will refer for then   also get psa with labs   Complete Medication List: 1)  Maxzide-25 37.5-25 Mg Tabs (Triamterene-hctz) .... Take 1 tablet by mouth once a day 2)  Zocor 40 Mg Tabs (Simvastatin) .Marland Kitchen.. 1 by mouth once daily  Other Orders: TLB-PSA (Prostate Specific Antigen) (84153-PSA) Gastroenterology Referral (GI)  Hypertension Assessment/Plan:      The patient's hypertensive risk group is category B: At least one risk factor (excluding diabetes) with no target organ damage.  His calculated 10 year risk of coronary heart disease is 11 %.  Today's blood pressure is 130/90.  His blood pressure goal is < 140/90.  Patient Instructions: 1)  You will be informed of lab results when available.     2)  monitor your BP readings  to ensure  below 140/90 .  3)  continue medications.  4)  will   call about colonsoscopy referral   for September . 5)  ROV in 6 months or as needed  Prescriptions: MAXZIDE-25 37.5-25 MG TABS (TRIAMTERENE-HCTZ) Take 1 tablet by mouth once a day  #30 Tablet x 12   Entered and Authorized by:   Madelin Headings MD   Signed by:   Madelin Headings MD on 11/15/2009   Method used:   Electronically to        [facility] [phone]* (retail)       [address]       [place], [place]  [address]       Ph: [phone] or [phone]       Fax: [phone]   RxID:   [id number] ZOCOR 40 MG  TABS (SIMVASTATIN) 1 by mouth once daily  #30 x 12   Entered and Authorized by:   Madelin Headings MD   Signed by:   Madelin Headings MD on 11/15/2009   Method used:   Electronically to        [facility] [address] [phone]* (retail)       [address]       [place], [place]  [address]       Ph: [phone] or [phone]       Fax: [phone]   RxID:   [id number]

## 2010-07-12 NOTE — Letter (Signed)
Summary: Pre Visit Letter Revised  [facility]  [address] [place], [place] [address]   Phone: [phone]  Fax: [phone]        02/15/2010 MRN: [id number] [facility] [address] [place], [place]  [address]             Procedure Date:  03-22-10   Welcome to the Gastroenterology Division at [facility].    You are scheduled to see a nurse for your pre-procedure visit on 03-08-10 at 8:00a.m. on the 3rd floor at [facility], [address]  We ask that you try to arrive at our office 15 minutes prior to your appointment time to allow for check-in.  Please take a minute to review the attached form.  If you answer "Yes" to one or more of the questions on the first page, we ask that you call the person listed at your earliest opportunity.  If you answer "No" to all of the questions, please complete the rest of the form and bring it to your appointment.    Your nurse visit will consist of discussing your medical and surgical history, your immediate family medical history, and your medications.   If you are unable to list all of your medications on the form, please bring the medication bottles to your appointment and we will list them.  We will need to be aware of both prescribed and over the counter drugs.  We will need to know exact dosage information as well.    Please be prepared to read and sign documents such as consent forms, a financial agreement, and acknowledgement forms.  If necessary, and with your consent, a friend or relative is welcome to sit-in on the nurse visit with you.  Please bring your insurance card so that we may make a copy of it.  If your insurance requires a referral to see a specialist, please bring your referral form from your primary care physician.  No co-pay is required for this nurse visit.     If you cannot keep your appointment, please call [phone] to cancel or reschedule prior to your appointment date.  This  allows [place] the opportunity to schedule an appointment for another patient in need of care.    Thank you for choosing [facility] for your medical needs.  We appreciate the opportunity to care for you.  Please visit [place] at our website  to learn more about our practice.  Sincerely, The Gastroenterology Division

## 2010-07-12 NOTE — Progress Notes (Signed)
Summary: prep   Medications Added MOVIPREP 100 GM  SOLR (PEG-KCL-NACL-NASULF-NA ASC-C) As per prep instructions.       Phone Note Call from Patient Call back at [facility] Phone [phone]   Caller: Patient Call For: Dr. Christella Hartigan Reason for Call: Talk to Nurse Summary of Call: needs prep recalled... [facility] Initial call taken by: Vallarie Mare,  May 11, 2010 8:45 AM    New/Updated Medications: MOVIPREP 100 GM  SOLR (PEG-KCL-NACL-NASULF-NA ASC-C) As per prep instructions. Prescriptions: MOVIPREP 100 GM  SOLR (PEG-KCL-NACL-NASULF-NA ASC-C) As per prep instructions.  #1 x 0   Entered by:   Wyona Almas RN   Authorized by:   Rachael Fee MD   Signed by:   Wyona Almas RN on 05/11/2010   Method used:   Electronically to        [facility] [phone]* (retail)       [address]       [place], [place]  [address]       Ph: [phone] or [phone]       Fax: [phone]   RxID:   [id number]

## 2010-07-12 NOTE — Letter (Signed)
Summary: Generic Letter  [facility]  [address] [place], [place] [address]   Phone: [phone]  Fax: [phone]    10/26/2009  [facility] [address] [place], [place]  [address]  Dear Mr. Merle,  We have been trying to contact you about scheduling a follow up appt. We got a refill request for your blood pressure medication and refilled it for this month only. Please call our office before your next refill to schedule this appt.          Sincerely,   Tor Netters, CMA (AAMA)

## 2010-07-12 NOTE — Progress Notes (Signed)
Summary: cx fee?   Phone Note Call from Patient   Caller: Patient Call For: Dr. Christella Hartigan Reason for Call: Talk to Nurse Summary of Call: pt cancelled COL scheduled for tomorrow... due to a sudden work obligation... very apologetic... resch'ed for December Dr. Christella Hartigan, do you wish to charge this pt a cx fee? Initial call taken by: Vallarie Mare,  April 06, 2010 3:34 PM  Follow-up for Phone Call        no, not as long as he rescheduled Follow-up by: Rachael Fee MD,  April 06, 2010 4:10 PM  Additional Follow-up for Phone Call Additional follow up Details #1::        Patient NOT BILLED. Additional Follow-up by: Leanor Kail [facility],  April 11, 2010 3:55 PM

## 2010-07-12 NOTE — Progress Notes (Signed)
Summary: Pt req to get a copy of labs from June 2011  Phone Note Call from Patient Call back at Work Phone [phone]   Caller: Patient Summary of Call: Pt called and req to get copy of lab results from June. Pls call when ready for pick up.       Initial call taken by: Lucy Antigua,  January 20, 2010 10:55 AM  Follow-up for Phone Call        Labs ready to pick up. Pt aware Follow-up by: Romualdo Bolk, CMA (AAMA),  January 20, 2010 11:06 AM

## 2010-07-13 NOTE — Progress Notes (Addendum)
Summary: Resch'd COL   Phone Note Call from Patient Call back at Home Phone [phone]   Caller: Patient Call For: Dr. Christella Hartigan Summary of Call: Pt. r/s his COL from tomorrow until 08-15-10 b/c he has not met his deductible for the year and cannot afford at this time. Would you like pt. charged the cancelation fee? Initial call taken by: Karna Christmas,  June 27, 2010 9:32 AM  Follow-up for Phone Call        this is his third last minute cancellation for the colonoscopy.  please charge him the cancellation fee and also do not schedule him for any further procedures.  he needs to see me  in the office as a new gi patient before he should be scheduled again Follow-up by: Rachael Fee MD,  June 27, 2010 9:36 AM  Additional Follow-up for Phone Call Additional follow up Details #1::        Called pt. to r/s his COL to a NP3 per Dr. Christella Hartigan. He insisted not to cancel the appt and requested to speak directly to nurse or Dr. Christella Hartigan regarding his appt. first. Additional Follow-up by: Karna Christmas,  June 27, 2010 10:20 AM    Additional Follow-up for Phone Call Additional follow up Details #2::    Message left to call back.   Teryl Lucy RN  June 27, 2010 10:34 AM   patient has an appointment with Dr Christella Hartigan 08/15/10 to discuss. Follow-up by: Darcey Nora RN, CGRN,  June 28, 2010 4:45 PM   Appended Document: Resch'd COL dixie, can you look into this.  He was put on my schedule for a colonoscopy this AM, did not show.  I'm not sure how he ended up on my [facility] schedule for a third for fourth time, especially after the above notes.  Appended Document: Resch'd COL Dr. Shela Commons.  This patient was rescheduled by phone, Karna Christmas, for 08/15/10 and was not scheduled for another pre-visit. Pt. procedure was not cancelled in the system, but Cordelia Pen posted he was scheduled for NP3 to see you.  However, you were up here that day. We print the schedule for procedures, we have no idea about your  office schedule.  I will look further into this and let you know.

## 2010-08-15 ENCOUNTER — Other Ambulatory Visit: Payer: Self-pay | Admitting: Gastroenterology

## 2010-08-17 ENCOUNTER — Telehealth (INDEPENDENT_AMBULATORY_CARE_PROVIDER_SITE_OTHER): Payer: Self-pay | Admitting: *Deleted

## 2010-08-21 ENCOUNTER — Encounter: Payer: Self-pay | Admitting: Internal Medicine

## 2010-08-21 ENCOUNTER — Ambulatory Visit (INDEPENDENT_AMBULATORY_CARE_PROVIDER_SITE_OTHER): Payer: Medicare HMO | Admitting: Internal Medicine

## 2010-08-21 VITALS — BP 120/80 | HR 101 | Temp 98.4°F | Ht 68.0 in | Wt 165.0 lb

## 2010-08-21 DIAGNOSIS — R109 Unspecified abdominal pain: Secondary | ICD-10-CM

## 2010-08-21 DIAGNOSIS — D869 Sarcoidosis, unspecified: Secondary | ICD-10-CM

## 2010-08-21 DIAGNOSIS — I10 Essential (primary) hypertension: Secondary | ICD-10-CM

## 2010-08-21 NOTE — Assessment & Plan Note (Signed)
No change due for labs June and cpx.  ekg normal today

## 2010-08-21 NOTE — Assessment & Plan Note (Signed)
Get PFTS as directed

## 2010-08-21 NOTE — Patient Instructions (Signed)
Your ekg is normal today  Call if having a problem  You are due for check up with full set of labs in June  Get your PFTS done.

## 2010-08-21 NOTE — Assessment & Plan Note (Signed)
Nl ekg and BP  No change  today   .Marland Kitchen Disc alarm features   To call about

## 2010-08-22 NOTE — Progress Notes (Signed)
Summary: sent scheduling information to Dr. Shela Commons

## 2010-08-24 ENCOUNTER — Encounter: Payer: Self-pay | Admitting: Internal Medicine

## 2010-08-24 NOTE — Progress Notes (Signed)
  Subjective:    Patient ID: Tyler Barrera, male    DOB: 10-31-1960, 50 y.o.   MRN: [id number]  HPI Patient comes in today for follow-up of his hypertension medical issues. Since his last visit he has had no major changes in his health status however he has had a lot of stress of various sorts.  He's taking his blood pressure medication and it appears to be controlled he has no shortness of breath or increasing cough is due for a sarcoid check. Over the last couple weeks he has had some unusual chest upper abdomen discomfort that he beat [place] began after lifting certain kinds of weights he says it feels like and also her or the lining of his stomach is irritated. He denies any nausea vomiting diarrhea may be occasional twinge on his left chest. Past Medical History  Diagnosis Date  . Hyperlipidemia   . Hypertension   . Pulmonary sarcoidosis     Dr Shelle Iron    Past Surgical History  Procedure Date  . Bronchoscopy     reports that he has never smoked. He does not have any smokeless tobacco history on file. He reports that he drinks alcohol. His drug history not on file. family history includes Cancer in his father; Hyperlipidemia in his father and mother; and Hypertension in his father and mother. No Known Allergies   Review of Systems No fever weight loss swollen glands bleeding new joint problems  Rest of ros neg or as per hpi    Objective:   Physical Exam Well-developed well-nourished and no acute distress HEENT: Normocephalic ;atraumatic , Eyes;  PERRL, EOMs  Full, lids and conjunctiva clear,,Ears: no deformities, canals nl, TM landmarks normal, Nose: no deformity or discharge  Mouth : OP clear without lesion or edema . Chest:  Clear to A&P without wheezes rales or rhonchi  No specific tenderness noted CV:  S1-S2 no gallops or murmurs peripheral perfusion is normal Abdomen:  Sof,t normal bowel sounds without hepatosplenomegaly, no guarding rebound or masses no CVA tenderness  He points  to the app and astral up under the left rib cage is the area of issue. Ext No clubbing cyanosis or edema  EKG G reviewed and is normal with no acute changes reviewed with patient       Assessment & Plan:  Hypertension  Controlled on medication Abdominal chest discomfort: unclear etiology but is probably not cardiac may be musculoskeletal or G.I. Discussed alarm features where he should return if needed before his check Sarcoidosis  Apparently stable Hyperlipidemia     Continue check levels a checkup

## 2010-09-08 ENCOUNTER — Encounter: Payer: Self-pay | Admitting: Internal Medicine

## 2010-10-06 ENCOUNTER — Other Ambulatory Visit: Payer: Self-pay | Admitting: Internal Medicine

## 2010-10-27 NOTE — Op Note (Signed)
Tyler Barrera, Tyler Barrera                ACCOUNT NO.:  [id number]   MEDICAL RECORD NO.:  [id number]          PATIENT TYPE:  AMB   LOCATION:  CARD                         FACILITY:  [facility]   PHYSICIAN:  Marcelyn Bruins, M.D. [facility] DATE OF BIRTH:  [birth year]   DATE OF PROCEDURE:  09/19/2005  DATE OF DISCHARGE:                                 OPERATIVE REPORT   PROCEDURES:  Flexible fiberoptic bronchoscopy with transbronchial lung  biopsies and bronchoalveolar lavage.   INDICATIONS FOR PROCEDURE:  Bilateral pulmonary infiltrates with  lymphadenopathy of unknown etiology.  Rule out sarcoidosis.   OPERATOR:  Marcelyn Bruins, M.D.   ANESTHESIA:  Demerol 50 mg IV, Versed 7 mg IV, and topical 1% lidocaine to  vocal cords and airways during the procedure.   DESCRIPTION:  After obtaining informed consent and under close  cardiopulmonary monitoring, the above preop anesthesia was given and  fiberoptic scope was passed through the right naris and posterior pharynx.  There was no lesions were or other abnormalities seen.  The vocal cords  appeared to be within normal limits.  The scope was then passed into the  trachea where it was examined along its entire length down to the level of  carina all of which was normal.  The left tracheobronchial tree was examined  to the subsegmental level with no endobronchial abnormality being found.  The right tracheobronchial tree appeared normal as well except for one area  in the right mainstem bronchus that raises the question of cobblestoning.  Bronchoalveolar lavage was then done in the right upper lobe and right lower  lobe with good material being obtained, transbronchial lung biopsies were  then done in the right lower lobe and right upper lobe under fluoroscopic  guidance with reasonable material being obtained.  There was mild bleeding  postbiopsy which was controlled with tamponade.  The patient was  hemodynamically stable and had good hemostasis by the end of  procedure.  Overall the patient tolerated the procedure well.  There were no  complications.  Fluoroscopic exam of the lung after biopsy did not show any  obvious pneumothorax.  A upright PA and lateral chest x-ray is pending to  rule out pneumothorax postbiopsy.           ______________________________  Marcelyn Bruins, M.D. [facility]     KC/MEDQ  D:  09/19/2005  T:  09/19/2005  Job:  [id number]

## 2010-11-09 ENCOUNTER — Other Ambulatory Visit (INDEPENDENT_AMBULATORY_CARE_PROVIDER_SITE_OTHER): Payer: Medicare HMO

## 2010-11-09 DIAGNOSIS — Z79899 Other long term (current) drug therapy: Secondary | ICD-10-CM

## 2010-11-09 DIAGNOSIS — Z0000 Encounter for general adult medical examination without abnormal findings: Secondary | ICD-10-CM

## 2010-11-09 DIAGNOSIS — E785 Hyperlipidemia, unspecified: Secondary | ICD-10-CM

## 2010-11-09 DIAGNOSIS — I10 Essential (primary) hypertension: Secondary | ICD-10-CM

## 2010-11-09 DIAGNOSIS — Z125 Encounter for screening for malignant neoplasm of prostate: Secondary | ICD-10-CM

## 2010-11-09 LAB — CBC WITH DIFFERENTIAL/PLATELET
Basophils Absolute: 0 10*3/uL (ref 0.0–0.1)
Eosinophils Absolute: 0.2 10*3/uL (ref 0.0–0.7)
Lymphocytes Relative: 23.4 % (ref 12.0–46.0)
MCHC: 33.9 g/dL (ref 30.0–36.0)
MCV: 83.8 fl (ref 78.0–100.0)
RBC: 5.08 Mil/uL (ref 4.22–5.81)
RDW: 13.4 % (ref 11.5–14.6)
WBC: 4.1 10*3/uL — ABNORMAL LOW (ref 4.5–10.5)

## 2010-11-09 LAB — BASIC METABOLIC PANEL
CO2: 30 mEq/L (ref 19–32)
Calcium: 9.5 mg/dL (ref 8.4–10.5)
Chloride: 102 mEq/L (ref 96–112)
Glucose, Bld: 114 mg/dL — ABNORMAL HIGH (ref 70–99)
Potassium: 3.4 mEq/L — ABNORMAL LOW (ref 3.5–5.1)

## 2010-11-09 LAB — HEPATIC FUNCTION PANEL
ALT: 28 U/L (ref 0–53)
Total Protein: 7.6 g/dL (ref 6.0–8.3)

## 2010-11-09 LAB — POCT URINALYSIS DIPSTICK
Blood, UA: NEGATIVE
Leukocytes, UA: NEGATIVE
Nitrite, UA: NEGATIVE
Protein, UA: NEGATIVE
Spec Grav, UA: 1.025
Urobilinogen, UA: 1

## 2010-11-09 LAB — PSA: PSA: 0.55 ng/mL (ref 0.10–4.00)

## 2010-11-09 LAB — LIPID PANEL
LDL Cholesterol: 87 mg/dL (ref 0–99)
Total CHOL/HDL Ratio: 3
Triglycerides: 85 mg/dL (ref 0.0–149.0)

## 2010-11-16 ENCOUNTER — Other Ambulatory Visit: Payer: Self-pay | Admitting: Internal Medicine

## 2010-11-17 ENCOUNTER — Ambulatory Visit: Payer: Medicare HMO | Admitting: Internal Medicine

## 2010-11-27 ENCOUNTER — Telehealth: Payer: Self-pay | Admitting: Pulmonary Disease

## 2010-11-27 NOTE — Telephone Encounter (Signed)
Pt last seen August 2010- was told to followup in 1 yr. No appt with [facility] pending. Looks like Dr. Fabian Sharp ordered PFT's. LMTCB

## 2010-11-28 NOTE — Telephone Encounter (Signed)
lmomtcb  

## 2010-11-29 NOTE — Telephone Encounter (Signed)
LMTCBx2. Caeden Foots, CMA  

## 2010-11-30 NOTE — Telephone Encounter (Signed)
lmomtcb  

## 2010-12-01 NOTE — Telephone Encounter (Signed)
LMTCB and will sign per protocol  

## 2010-12-04 ENCOUNTER — Ambulatory Visit (INDEPENDENT_AMBULATORY_CARE_PROVIDER_SITE_OTHER): Payer: Medicare HMO | Admitting: Internal Medicine

## 2010-12-04 ENCOUNTER — Encounter: Payer: Self-pay | Admitting: Internal Medicine

## 2010-12-04 ENCOUNTER — Telehealth: Payer: Self-pay | Admitting: Pulmonary Disease

## 2010-12-04 VITALS — BP 140/80 | HR 78 | Ht 65.75 in | Wt 165.0 lb

## 2010-12-04 DIAGNOSIS — R739 Hyperglycemia, unspecified: Secondary | ICD-10-CM

## 2010-12-04 DIAGNOSIS — R7309 Other abnormal glucose: Secondary | ICD-10-CM

## 2010-12-04 DIAGNOSIS — Z0000 Encounter for general adult medical examination without abnormal findings: Secondary | ICD-10-CM

## 2010-12-04 DIAGNOSIS — E876 Hypokalemia: Secondary | ICD-10-CM | POA: Insufficient documentation

## 2010-12-04 DIAGNOSIS — D869 Sarcoidosis, unspecified: Secondary | ICD-10-CM

## 2010-12-04 DIAGNOSIS — I10 Essential (primary) hypertension: Secondary | ICD-10-CM

## 2010-12-04 DIAGNOSIS — E785 Hyperlipidemia, unspecified: Secondary | ICD-10-CM

## 2010-12-04 MED ORDER — TRIAMTERENE-HCTZ 37.5-25 MG PO TABS: 1.0000 | ORAL_TABLET | Freq: Every day | ORAL | Status: DC

## 2010-12-04 MED ORDER — SIMVASTATIN 40 MG PO TABS: 40.0000 mg | ORAL_TABLET | Freq: Every day | ORAL | Status: DC

## 2010-12-04 NOTE — Telephone Encounter (Signed)
Pt returned call from nurse. [phone]. Tyler Barrera

## 2010-12-04 NOTE — Progress Notes (Signed)
  Subjective:    Patient ID: Tyler Barrera, male    DOB: November 15, 1960, 50 y.o.   MRN: [id number]  HPI Patient comes in today for preventive visit and follow-up of medical issues. Update of  history since last visit.  Chest stomach  Discomfort is better since last visit .   HT: Readings per patient 120-130 range.   80 range.  Cough/sarcoid:   ocass  And ok   Now  Usually comes in fall.  No sob or wheeze  Golf tennis and biking exercise.  LIPIDS:  No problem with meds .   Or se   Review of Systems ROS:  GEN/ HEENTNo fever, significant weight changes sweats headaches vision problems hearing changes, CV/ PULM; No chest pain shortness of breath , syncope,edema  change in exercise tolerance. GI /GU: No adominal pain, vomiting, change in bowel habits. No blood in the stool. No significant GU symptoms. SKIN/HEME: ,no acute skin rashes suspicious lesions or bleeding. No lymphadenopathy, nodules, masses.  NEURO/ PSYCH:  No neurologic signs such as weakness numbness No depression anxiety. IMM/ Allergy: No unusual infections.  Allergy .   REST of 12 system review negative or as per HPI   Past history family history social history reviewed in the electronic medical record.      Objective:   Physical Exam Physical Exam: Vital signs reviewed VZD:GLOV is a well-developed well-nourished alert cooperative  AA  male  who appears   stated age in no acute distress.  HEENT: normocephalic  traumatic , Eyes: PERRL EOM's full, conjunctiva clear, Nares: patent no deformity discharge or tenderness., Ears: no deformity EAC's clear TMs with normal landmarks. Mouth: clear OP, no lesions, edema.  Moist mucous membranes. Dentition in adequate repair. NECK: supple without masses, thyromegaly or bruits. CHEST/PULM:  Clear to auscultation and percussion breath sounds equal no wheeze , rales or rhonchi. No chest wall deformities or tenderness. CV: PMI is nondisplaced, S1 S2 no gallops, murmurs, rubs. Peripheral pulses  are full without delay.No JVD .  ABDOMEN: Bowel sounds normal nontender  No guard or rebound, no hepato splenomegal no CVA tenderness.  No hernia. Extremtities:  No clubbing cyanosis or edema, no acute joint swelling or redness no focal atrophy NEURO:  Oriented x3, cranial nerves 3-12 appear to be intact, no obvious focal weakness,gait within normal limits no abnormal reflexes or asymmetrical SKIN: No acute rashes normal turgor, color, no bruising or petechiae. PSYCH: Oriented, good eye contact, no obvious depression anxiety, cognition and judgment appear normal. LN:  No cervical axillary or inguinal adenopathy  Labs reviewed :    k 3.4 wbc 4.1  fbs 111         Assessment & Plan:  Preventive Health Care UTD parameters   Had colon 3 12  Counseled regarding healthy nutrition, exercise, sleep, injury prevention, calcium vit d and healthy weight .continue exercise as he is doing .  HT  slighlty up in office  Normal at home Low K borderline  No sx  Fasting hyperglycemia  Poss agg by statin but  rec lsi  Lipids  Controlled no se of meds  Sarcoid  Currently no to minimal sx .  Refill meds for a year

## 2010-12-04 NOTE — Patient Instructions (Addendum)
Minimize salt and sodium in the diet increase potassium in the diet. Continue healthy lifestyle intervention. Recheck potassium level with magnesium level in about a month. If this is okay and you were feeling well Recheck in 6 months.  Foods Rich in Potassium Food Portion Potassium (mg) Food Portion Potassium (mg)  Apricots, dried 1/4 cup 378 Orange juice 1 cup 443  Apricots, raw  1 cup halves 401 Peaches, dried 1/4 cup 398  Avocado 1/2 487 Peas, split, cooked 1/2 cup 355  Banana 1 large 487 Potato, boiled 1 medium 515  Beef lean, round 3 oz 202 Prunes, dried, uncooked 1/4 cup 318  Cantaloupe 1 cup cubes 427 Raisins 1/4 cup 309  Dates, medjool 5 whole 835 Salmon, pink, raw 3 oz 275  Ham, cured 3 oz 212 Sardines, canned 3 oz 338  Lentils, dried 1/4 cup 458 Tomato, raw 1 medium 292  Lima beans, frozen 1/2 cup 258 Tomato juice 6 fl oz 417  Orange 1 large 333 [place] 3 oz 349  Source: [employer] Database Document Released: 05/28/2005 Document Re-Released: 11/15/2009 [facility] Patient Information 2011 [place], [place].

## 2010-12-04 NOTE — Telephone Encounter (Signed)
See last phone note- LMTCB

## 2010-12-05 NOTE — Telephone Encounter (Signed)
lmomtcb  

## 2010-12-06 NOTE — Telephone Encounter (Signed)
LMTCB and will again, sign per protocol

## 2010-12-19 ENCOUNTER — Ambulatory Visit (INDEPENDENT_AMBULATORY_CARE_PROVIDER_SITE_OTHER): Payer: Medicare HMO | Admitting: Pulmonary Disease

## 2010-12-19 ENCOUNTER — Telehealth: Payer: Self-pay | Admitting: Pulmonary Disease

## 2010-12-19 ENCOUNTER — Telehealth: Payer: Self-pay | Admitting: Internal Medicine

## 2010-12-19 DIAGNOSIS — D869 Sarcoidosis, unspecified: Secondary | ICD-10-CM

## 2010-12-19 LAB — PULMONARY FUNCTION TEST

## 2010-12-19 NOTE — Telephone Encounter (Signed)
I spoke with the patient and he states he cannot understand why he cannot get a cxr ordered without seeing Dr. Shelle Iron. I advised the pt that he was last seen in 2010 so he would need an appt before anything can be ordered. Pt was still upset with this,  But agreed to set an appt with [facility], but pt states it will be his last appt.Carron Curie, CMA

## 2010-12-19 NOTE — Telephone Encounter (Signed)
Want to change pulmonary doctor. They would not order a chest xray that he requested. He knows that he cannot use another [place] md. Please advise.

## 2010-12-19 NOTE — Progress Notes (Signed)
PFT done today. 

## 2010-12-20 ENCOUNTER — Encounter: Payer: Self-pay | Admitting: Pulmonary Disease

## 2010-12-20 ENCOUNTER — Ambulatory Visit (INDEPENDENT_AMBULATORY_CARE_PROVIDER_SITE_OTHER)
Admission: RE | Admit: 2010-12-20 | Discharge: 2010-12-20 | Disposition: A | Discharge status: Home / Self Care | Payer: Medicare HMO | Source: Ambulatory Visit | Attending: Pulmonary Disease | Admitting: Pulmonary Disease

## 2010-12-20 ENCOUNTER — Ambulatory Visit (INDEPENDENT_AMBULATORY_CARE_PROVIDER_SITE_OTHER): Payer: Medicare HMO | Admitting: Pulmonary Disease

## 2010-12-20 VITALS — BP 114/72 | HR 87 | Temp 97.6°F | Ht 67.0 in | Wt 170.0 lb

## 2010-12-20 DIAGNOSIS — D869 Sarcoidosis, unspecified: Secondary | ICD-10-CM

## 2010-12-20 NOTE — Patient Instructions (Signed)
Will check cxr today , and call you with results followup with me in one year, but call if having breathing issues in the interim.  Please call us about a month before your visit so we can arrange breathing tests and cxr before visit.

## 2010-12-20 NOTE — Progress Notes (Signed)
  Subjective:    Patient ID: Tyler Barrera, male    DOB: 16-Jan-1961, 50 y.o.   MRN: [id number]  HPI The pt comes in today for f/u of his known sarcoidosis.  He has not been seen in 2 yrs, and is overdue for pfts and cxr.  He feels he has been doing well, and denies any issues with his breathing.  He has stable exertional tolerance at a high level, and only has a cough with cold air exposure that is minimal.  He did have pfts recently which showed mild restriction, but actually better than 2010.  His airflows and DLCO were normal.    Review of Systems  Constitutional: Negative for fever and unexpected weight change.  HENT: Negative for ear pain, nosebleeds, congestion, sore throat, rhinorrhea, sneezing, trouble swallowing, dental problem, postnasal drip and sinus pressure.   Eyes: Negative for redness and itching.  Respiratory: Negative for cough, chest tightness, shortness of breath and wheezing.   Cardiovascular: Negative for palpitations and leg swelling.  Gastrointestinal: Negative for nausea and vomiting.  Genitourinary: Negative for dysuria.  Musculoskeletal: Negative for joint swelling.  Skin: Negative for rash.  Neurological: Negative for headaches.  Hematological: Does not bruise/bleed easily.  Psychiatric/Behavioral: Negative for dysphoric mood. The patient is not nervous/anxious.        Objective:   Physical Exam Wd male in nad Nares without discharge or purulence Chest totally clear Cor with rrr LE without edema or cyanosis Alert and oriented, moves all 4        Assessment & Plan:

## 2010-12-20 NOTE — Assessment & Plan Note (Signed)
The pt has a h/o sarcoidosis, and has not had f/u in almost 2 yrs.  He has been doing well from a clinical standpoint, but I stressed to him the need for yearly followup.  His most recent pfts are very stable, and will check cxr today for completeness.  Will see back in one year, but he is to call if having a change in his exercise tolerance.

## 2010-12-22 ENCOUNTER — Telehealth: Payer: Self-pay | Admitting: Pulmonary Disease

## 2010-12-22 NOTE — Telephone Encounter (Signed)
Pt advised of CXR results per append. Jennifer Castillo, CMA  

## 2010-12-25 ENCOUNTER — Encounter: Payer: Self-pay | Admitting: Pulmonary Disease

## 2010-12-26 NOTE — Telephone Encounter (Signed)
?   If there is some misunderstanding because note said to get chest x ray. Please check on this  . Otherwise may have to go to [facility] town.

## 2010-12-27 NOTE — Telephone Encounter (Signed)
Pt states that everything is fine and he wants to keep his md at pulmonary.

## 2011-01-04 ENCOUNTER — Other Ambulatory Visit: Payer: Medicare HMO

## 2011-01-05 ENCOUNTER — Other Ambulatory Visit (INDEPENDENT_AMBULATORY_CARE_PROVIDER_SITE_OTHER): Payer: Medicare HMO

## 2011-01-05 DIAGNOSIS — E876 Hypokalemia: Secondary | ICD-10-CM

## 2011-01-05 DIAGNOSIS — I10 Essential (primary) hypertension: Secondary | ICD-10-CM

## 2011-01-05 LAB — BASIC METABOLIC PANEL
BUN: 16 mg/dL (ref 6–23)
Calcium: 8.9 mg/dL (ref 8.4–10.5)
Creatinine, Ser: 1.1 mg/dL (ref 0.4–1.5)
GFR: 87.16 mL/min (ref >60.00)
Glucose, Bld: 105 mg/dL — ABNORMAL HIGH (ref 70–99)

## 2011-01-05 LAB — MAGNESIUM: Magnesium: 2.5 mg/dL (ref 1.5–2.5)

## 2011-01-17 NOTE — Progress Notes (Signed)
Pt aware results

## 2011-05-21 ENCOUNTER — Encounter: Payer: Self-pay | Admitting: Internal Medicine

## 2011-05-21 ENCOUNTER — Ambulatory Visit (INDEPENDENT_AMBULATORY_CARE_PROVIDER_SITE_OTHER): Payer: Medicare HMO | Admitting: Internal Medicine

## 2011-05-21 VITALS — BP 140/80 | HR 60 | Wt 169.0 lb

## 2011-05-21 DIAGNOSIS — M5430 Sciatica, unspecified side: Secondary | ICD-10-CM

## 2011-05-21 DIAGNOSIS — E785 Hyperlipidemia, unspecified: Secondary | ICD-10-CM

## 2011-05-21 DIAGNOSIS — I10 Essential (primary) hypertension: Secondary | ICD-10-CM

## 2011-05-21 NOTE — Progress Notes (Signed)
Subjective:    Patient ID: Tyler Barrera, male    DOB: 08-Mar-1961, 50 y.o.   MRN: [id number]  HPI Comes in for follow up of  multiple medical issues bp has been higher   New problem :had acute back pain and was hared to get out of bed. And then had radaiting pain down left leg. With bad aching.   Was seen in urgent care .  inflammed muscle  and given vicodin and prednisone . Got a headache.  And tingling fingers and ba taste.  Took 4 days.  Since then back got better  But  Still has residual leg pain HAs better.   No specificy injury   Known but does some lifting at work at a time.  Review of Systems NO cp sob new cough fever weight loss  ;as no gu gi changes  No vision changes  Past Medical History  Diagnosis Date  . Hyperlipidemia   . Hypertension   . Pulmonary sarcoidosis     Dr Shelle Iron     History   Social History  . Marital Status: Married    Spouse Name: N/A    Number of Children: N/A  . Years of Education: N/A   Occupational History  . Not on file.   Social History Main Topics  . Smoking status: Never Smoker   . Smokeless tobacco: Not on file  . Alcohol Use: Yes  . Drug Use: Not on file  . Sexually Active: Not on file   Other Topics Concern  . Not on file   Social History Narrative   Household of three marriedNon-smoker regular exercise[facility] care 40 hours plusNo pets Son at [employer]  Another teen at home.    Past Surgical History  Procedure Date  . Bronchoscopy     Family History  Problem Relation Age of Onset  . Hypertension Mother   . Hyperlipidemia Mother   . Cancer Father     prostate  . Hyperlipidemia Father   . Hypertension Father     No Known Allergies  Current Outpatient Prescriptions on File Prior to Visit  Medication Sig Dispense Refill  . simvastatin (ZOCOR) 40 MG tablet Take 1 tablet (40 mg total) by mouth at bedtime.  30 tablet  11  . triamterene-hydrochlorothiazide (MAXZIDE-25) 37.5-25 MG per tablet Take 1 tablet  by mouth daily.  30 tablet  11    BP 140/80  Pulse 60  Wt 169 lb (76.658 kg)       Objective:   Physical Exam WDWN in nad  Looks healthy   Neck: Supple without adenopathy or masses or bruits Chest:  Clear to A&P without wheezes rales or rhonchi CV:  S1-S2 no gallops or murmurs peripheral perfusion is normal Abdomen:  Sof,t normal bowel sounds without hepatosplenomegaly, no guarding rebound or masses no CVA tenderness NEURO: oriented x 3 CN 3-12 appear intact. No focal muscle weakness or atrophy. DTRs symmetrical. Gait WNL.  Grossly non focal. No tremor or abnormal movement. BACK  Non specific tenderness  Neg slr  Toe heel walk is ok .   Oriented x 3 and no noted deficits in memory, attention, and speech.       Assessment & Plan:  HT  Up  today ? From pain or other  . borderline  Disc tracking readings  For now  If needed will adjust med dosing or  Change of ace hctx however by hx seems to have bp controlled.   New sciatica  Some better  Disc dx and plan if not improving consider further evaluation. Care with nsaid effect on  BP  LIPIDS  Nose of meds noted

## 2011-05-21 NOTE — Patient Instructions (Signed)
Sciatica Sciatica is a weakness and/or changes in sensation (tingling, jolts, hot and cold, numbness) along the path the sciatic nerve travels. Irritation or damage to lumbar nerve roots is often also referred to as lumbar radiculopathy.  Lumbar radiculopathy (Sciatica) is the most common form of this problem. Radiculopathy can occur in any of the nerves coming out of the spinal cord. The problems caused depend on which nerves are involved. The sciatic nerve is the large nerve supplying the branches of nerves going from the hip to the toes. It often causes a numbness or weakness in the skin and/or muscles that the sciatic nerve serves. It also may cause symptoms (problems) of pain, burning, tingling, or electric shock-like feelings in the path of this nerve. This usually comes from injury to the fibers that make up the sciatic nerve. Some of these symptoms are low back pain and/or unpleasant feelings in the following areas:  From the mid-buttock down the back of the leg to the back of the knee.   And/or the outside of the calf and top of the foot.   And/or behind the inner ankle to the sole of the foot.  CAUSES   Herniated or slipped disc. Discs are the little cushions between the bones in the back.   Pressure by the piriformis muscle in the buttock on the sciatic nerve (Piriformis Syndrome).   Misalignment of the bones in the lower back and buttocks (Sacroiliac Joint Derangement).   Narrowing of the spinal canal that puts pressure on or pinches the fibers that make up the sciatic nerve.   A slipped vertebra that is out of line with those above or beneath it.   Abnormality of the nervous system itself so that nerve fibers do not transmit signals properly, especially to feet and calves (neuropathy).   Tumor (this is rare).  Your caregiver can usually determine the cause of your sciatica and begin the treatment most likely to help you. TREATMENT  Taking over-the-counter painkillers, physical  therapy, rest, exercise, spinal manipulation, and injections of anesthetics and/or steroids may be used. Surgery, acupuncture, and Yoga can also be effective. Mind over matter techniques, mental imagery, and changing factors such as your bed, chair, desk height, posture, and activities are other treatments that may be helpful. You and your caregiver can help determine what is best for you. With proper diagnosis, the cause of most sciatica can be identified and removed. Communication and cooperation between your caregiver and you is essential. If you are not successful immediately, do not be discouraged. With time, a proper treatment can be found that will make you comfortable. HOME CARE INSTRUCTIONS   If the pain is coming from a problem in the back, applying ice to that area for 15 to 20 minutes, 3 to 4 times per day while awake, may be helpful. Put the ice in a plastic bag. Place a towel between the bag of ice and your skin.   You may exercise or perform your usual activities if these do not aggravate your pain, or as suggested by your caregiver.   Only take over-the-counter or prescription medicines for pain, discomfort, or fever as directed by your caregiver.   If your caregiver has given you a follow-up appointment, it is very important to keep that appointment. Not keeping the appointment could result in a chronic or permanent injury, pain, and disability. If there is any problem keeping the appointment, you must call back to this facility for assistance.  SEEK IMMEDIATE MEDICAL CARE   IF:   You experience loss of control of bowel or bladder.   You have increasing weakness in the trunk, buttocks, or legs.   There is numbness in any areas from the hip down to the toes.   You have difficulty walking or keeping your balance.   You have any of the above, with fever or forceful vomiting.  Document Released: 05/22/2001 Document Revised: 02/07/2011 Document Reviewed: 01/09/2008 [facility] Patient  Information 2012 [place], [place].    OK to monitor  Your blood pressure readings and avoid excess sodium and salt.    Can try  Ibuprofen 600- 800 mg twice a day for a week to see if pain  Subsides however this could elevated BP readings temporarily.   ROV  If  not getting to goal in the next 1-2 months or call about the back pain if not  Resolving. And we may need a specialist to see you for your back.    Otherwise  Check up  In 6 months with labs.

## 2011-05-24 DIAGNOSIS — M5430 Sciatica, unspecified side: Secondary | ICD-10-CM | POA: Insufficient documentation

## 2011-06-23 ENCOUNTER — Ambulatory Visit (INDEPENDENT_AMBULATORY_CARE_PROVIDER_SITE_OTHER): Payer: Medicare HMO | Admitting: Internal Medicine

## 2011-06-23 VITALS — BP 146/98 | HR 88 | Temp 97.6°F | Wt 163.0 lb

## 2011-06-23 DIAGNOSIS — IMO0001 Reserved for inherently not codable concepts without codable children: Secondary | ICD-10-CM

## 2011-06-23 DIAGNOSIS — M545 Low back pain: Secondary | ICD-10-CM

## 2011-06-23 MED ORDER — CYCLOBENZAPRINE HCL 5 MG PO TABS: 5.0000 mg | ORAL_TABLET | Freq: Three times a day (TID) | ORAL | Status: DC | PRN

## 2011-06-23 MED ORDER — DICLOFENAC SODIUM 75 MG PO TBEC: 75.0000 mg | DELAYED_RELEASE_TABLET | Freq: Two times a day (BID) | ORAL | Status: DC

## 2011-06-23 MED ORDER — CYCLOBENZAPRINE HCL 5 MG PO TABS: 5.0000 mg | ORAL_TABLET | Freq: Three times a day (TID) | ORAL | Status: AC | PRN

## 2011-06-23 NOTE — Progress Notes (Signed)
Addended by: Maurice Small on: 06/23/2011 11:19 AM   Modules accepted: Orders

## 2011-06-23 NOTE — Progress Notes (Signed)
  Subjective:    Patient ID: Tyler Barrera, male    DOB: [birth year], 51 y.o.   MRN: [id number]  HPI Extremity pain Location: in low back then LLE Onset:9 pm last night as he tried to arise from the couch Trigger/injury: No; no PMH of back injury Pain quality:aching , throbbing Pain severity:up to 10 Duration:constant Radiation:no Exacerbating factors:standing up Treatment/response:heating pad, hot bath, ice with minimal benefit Review of systems: Constitutional: no fever, chills, sweats Musculoskeletal:no  muscle cramps or pain; no  joint stiffness, redness, or swelling Skin:no rash, color change Neuro: leg weakness;no  incontinence (stool/urine);no  numbness and tingling Heme:no lymphadenopathy; abnormal bruising or bleeding .  He has similar issue in November/12 for which he was seen at urgent care. An inflamed muscle was diagnosed      Review of Systems     Objective:   Physical Exam  He appears healthy and well-nourished. He is in obvious discomfort due to the back pain.  There is no malalignment of the spine. There is no tenderness to percussion over the lumbosacral area.  The skin is clear with no lesions or rashes.  He exhibits the classic "low back crawl " as he lies back and sits up. With straight leg raising he has pain in the left thigh.  Deep tendon reflexes, strength and tone are good. He does experience pain to opposition to lifting the thigh.  Heel and toe gait were completed.  He has no clubbing, cyanosis, or edema.       Assessment & Plan: #1 classic LB syndrome   #1 classic LB syndrome.  Plan: See orders and recommendations.

## 2011-06-23 NOTE — Patient Instructions (Signed)
The best exercises for the low back include freestyle swimming, stretch aerobics, and yoga. 

## 2011-06-28 ENCOUNTER — Encounter: Payer: Self-pay | Admitting: Family Medicine

## 2011-06-28 ENCOUNTER — Ambulatory Visit (INDEPENDENT_AMBULATORY_CARE_PROVIDER_SITE_OTHER): Payer: Medicare HMO | Admitting: Family Medicine

## 2011-06-28 ENCOUNTER — Ambulatory Visit: Payer: Medicare HMO | Admitting: Internal Medicine

## 2011-06-28 VITALS — BP 142/100 | Temp 98.2°F | Wt 169.0 lb

## 2011-06-28 DIAGNOSIS — IMO0002 Reserved for concepts with insufficient information to code with codable children: Secondary | ICD-10-CM

## 2011-06-28 DIAGNOSIS — M5416 Radiculopathy, lumbar region: Secondary | ICD-10-CM

## 2011-06-28 MED ORDER — OXYCODONE-ACETAMINOPHEN 5-325 MG PO TABS: ORAL_TABLET | ORAL | Status: DC

## 2011-06-28 MED ORDER — PREDNISONE 10 MG PO TABS: ORAL_TABLET | ORAL | Status: DC

## 2011-06-28 NOTE — Progress Notes (Signed)
  Subjective:    Patient ID: Tyler Barrera, male    DOB: December 15, 1960, 51 y.o.   MRN: [id number]  HPI  Patient seen for ongoing low back pain. Onset about one week ago. Location is left lumbar with radiation down to about mid lower leg. He was seen in Saturday clinic and prescribed cyclobenzaprine  without much improvement. Pain is achy and throbbing at times. No numbness or weakness. No incontinence. Worse with standing. Had similar episode last November treated in urgent care with prednisone and Vicodin. Possible mild with hydrocodone. Patient went to chiropractor this past Monday. X-rays revealed question loss of disc space otherwise reportedly normal.  Patient denies any appetite or weight changes. No history of cancer. He has tried massage and yoga with mild relief.  Past Medical History  Diagnosis Date  . Hyperlipidemia   . Hypertension   . Pulmonary sarcoidosis     Dr Shelle Iron    Past Surgical History  Procedure Date  . Bronchoscopy     reports that he has never smoked. He does not have any smokeless tobacco history on file. He reports that he drinks alcohol. His drug history not on file. family history includes Cancer in his father; Hyperlipidemia in his father and mother; and Hypertension in his father and mother. Allergies  Allergen Reactions  . Prednisone     Nausea, "Out of it" . Note: He was also on Vicodin simultaneously      Review of Systems  Constitutional: Negative for fever, chills, appetite change and unexpected weight change.  Respiratory: Negative for shortness of breath.   Cardiovascular: Negative for chest pain and leg swelling.  Gastrointestinal: Negative for abdominal pain.  Genitourinary: Negative for dysuria.  Musculoskeletal: Positive for back pain. Negative for joint swelling.  Neurological: Negative for weakness and numbness.  Hematological: Negative for adenopathy. Does not bruise/bleed easily.       Objective:   Physical Exam  Constitutional: He  appears well-developed and well-nourished. No distress.  Cardiovascular: Normal rate and regular rhythm.   Pulmonary/Chest: Effort normal and breath sounds normal. No respiratory distress. He has no wheezes. He has no rales.  Musculoskeletal: He exhibits no edema.       Straight leg raise is negative bilaterally.  Neurological:       Deep tendon reflexes symmetric knee and ankle. Full-strength with plantar flexion dorsiflexion bilaterally. Full-strength with knee extension. Normal sensory function to touch.          Assessment & Plan:  Patient seen with acute lumbar back pain with left radiculopathy symptoms but nonfocal neurologic exam. Symptoms have deteriorated. Trial prednisone taper. Percocet 5/325 mg one to 2 every 6 hours when necessary for pain control and cautioned about possible sedation. Hold diclofenac at this time. Flexeril as needed. Follow up promptly for any new symptoms or worsening symptoms

## 2011-06-28 NOTE — Patient Instructions (Signed)
Follow up immediately for any weakness, progressive numbness, or worsening pain or if pain no better in 2 weeks.

## 2011-11-12 ENCOUNTER — Other Ambulatory Visit: Payer: Medicare HMO

## 2011-11-19 ENCOUNTER — Encounter: Payer: Medicare HMO | Admitting: Internal Medicine

## 2011-11-19 DIAGNOSIS — Z0289 Encounter for other administrative examinations: Secondary | ICD-10-CM

## 2011-12-08 ENCOUNTER — Other Ambulatory Visit: Payer: Self-pay | Admitting: Internal Medicine

## 2011-12-13 ENCOUNTER — Other Ambulatory Visit: Payer: Self-pay | Admitting: Internal Medicine

## 2011-12-20 ENCOUNTER — Ambulatory Visit: Payer: Medicare HMO | Admitting: Pulmonary Disease

## 2011-12-28 ENCOUNTER — Ambulatory Visit: Payer: Medicare HMO | Admitting: Pulmonary Disease

## 2012-01-16 ENCOUNTER — Other Ambulatory Visit (INDEPENDENT_AMBULATORY_CARE_PROVIDER_SITE_OTHER): Payer: Medicare HMO

## 2012-01-16 DIAGNOSIS — Z0000 Encounter for general adult medical examination without abnormal findings: Secondary | ICD-10-CM

## 2012-01-16 DIAGNOSIS — Z79899 Other long term (current) drug therapy: Secondary | ICD-10-CM

## 2012-01-16 DIAGNOSIS — Z125 Encounter for screening for malignant neoplasm of prostate: Secondary | ICD-10-CM

## 2012-01-16 LAB — CBC WITH DIFFERENTIAL/PLATELET
Basophils Absolute: 0 10*3/uL (ref 0.0–0.1)
Eosinophils Absolute: 0.2 10*3/uL (ref 0.0–0.7)
Hemoglobin: 14.9 g/dL (ref 13.0–17.0)
Lymphocytes Relative: 25.4 % (ref 12.0–46.0)
MCHC: 33.6 g/dL (ref 30.0–36.0)
MCV: 83.1 fl (ref 78.0–100.0)
Monocytes Absolute: 0.7 10*3/uL (ref 0.1–1.0)
Neutro Abs: 2.6 10*3/uL (ref 1.4–7.7)
RDW: 13.5 % (ref 11.5–14.6)

## 2012-01-16 LAB — POCT URINALYSIS DIPSTICK
Bilirubin, UA: NEGATIVE
Blood, UA: NEGATIVE
Glucose, UA: NEGATIVE
Ketones, UA: NEGATIVE
Spec Grav, UA: 1.02

## 2012-01-16 LAB — BASIC METABOLIC PANEL
CO2: 29 mEq/L (ref 19–32)
Calcium: 9.4 mg/dL (ref 8.4–10.5)
Chloride: 102 mEq/L (ref 96–112)
Creatinine, Ser: 1.1 mg/dL (ref 0.4–1.5)
Glucose, Bld: 86 mg/dL (ref 70–99)
Sodium: 140 mEq/L (ref 135–145)

## 2012-01-16 LAB — LIPID PANEL
HDL: 44.8 mg/dL (ref >39.00)
Total CHOL/HDL Ratio: 4

## 2012-01-16 LAB — TSH: TSH: 1.35 u[IU]/mL (ref 0.35–5.50)

## 2012-01-16 LAB — HEPATIC FUNCTION PANEL
ALT: 28 U/L (ref 0–53)
AST: 28 U/L (ref 0–37)
Alkaline Phosphatase: 71 U/L (ref 39–117)
Bilirubin, Direct: 0.1 mg/dL (ref 0.0–0.3)
Total Bilirubin: 0.9 mg/dL (ref 0.3–1.2)

## 2012-01-29 ENCOUNTER — Ambulatory Visit (INDEPENDENT_AMBULATORY_CARE_PROVIDER_SITE_OTHER): Payer: Medicare HMO | Admitting: Pulmonary Disease

## 2012-01-29 ENCOUNTER — Encounter: Payer: Self-pay | Admitting: Pulmonary Disease

## 2012-01-29 ENCOUNTER — Ambulatory Visit (INDEPENDENT_AMBULATORY_CARE_PROVIDER_SITE_OTHER)
Admission: RE | Admit: 2012-01-29 | Discharge: 2012-01-29 | Disposition: A | Discharge status: Home / Self Care | Payer: Medicare HMO | Source: Ambulatory Visit | Attending: Pulmonary Disease | Admitting: Pulmonary Disease

## 2012-01-29 VITALS — BP 122/84 | HR 84 | Temp 98.2°F | Ht 67.0 in | Wt 168.0 lb

## 2012-01-29 DIAGNOSIS — D869 Sarcoidosis, unspecified: Secondary | ICD-10-CM

## 2012-01-29 LAB — PULMONARY FUNCTION TEST

## 2012-01-29 NOTE — Progress Notes (Signed)
  Subjective:    Patient ID: Tyler Barrera, male    DOB: Aug 23, 1960, 51 y.o.   MRN: [id number]  HPI Patient comes in today for followup of his known sarcoidosis.  He had PFTs today that showed no significant change in his diffusion capacity, and only a mild decrease in his total capacity.  The patient is staying extremely active, and is satisfied with his exertional tolerance.  He is having no significant cough except with occasional cold air exposure.  He did not have his chest x-ray today, and this will need to be done.   Review of Systems  Constitutional: Negative for fever and unexpected weight change.  HENT: Negative for ear pain, nosebleeds, congestion, sore throat, rhinorrhea, sneezing, trouble swallowing, dental problem, postnasal drip and sinus pressure.   Eyes: Negative for redness and itching.  Respiratory: Negative for cough, chest tightness, shortness of breath and wheezing.   Cardiovascular: Negative for palpitations and leg swelling.  Gastrointestinal: Negative for nausea and vomiting.  Genitourinary: Negative for dysuria.  Musculoskeletal: Negative for joint swelling.  Skin: Negative for rash.  Neurological: Negative for headaches.  Hematological: Does not bruise/bleed easily.  Psychiatric/Behavioral: Negative for dysphoric mood. The patient is not nervous/anxious.   All other systems reviewed and are negative.       Objective:   Physical Exam Well-developed male in no acute distress Nose without purulence or discharge noted Chest with totally clear breath sounds, no wheezing Cardiac exam with regular rate and rhythm Lower extremities without edema, no cyanosis Alert and oriented, moves all 4 extremities.       Assessment & Plan:

## 2012-01-29 NOTE — Patient Instructions (Addendum)
Continue with your activities and conditioning program Will check a cxr today and call you with results. Will see you back in one year with cxr and breathing studies on the same day.  Please call if something changes before then.

## 2012-01-29 NOTE — Assessment & Plan Note (Signed)
The patient continues to do very well from a pulmonary standpoint, with no significant impact on his exertional tolerance and no consistent cough.  His PFTs today do show a slight decrease in his total lung capacity from last year, but I do not feel this is significant.  I've asked him to continue with his conditioning programs, and we'll see him back in one year with repeat PFTs and chest x-ray.  He is to call me in the interim if he has worsening pulmonary symptoms.

## 2012-01-29 NOTE — Progress Notes (Signed)
PFT done today. 

## 2012-01-29 NOTE — Addendum Note (Signed)
Addended by: Michel Bickers A on: 01/29/2012 02:36 PM   Modules accepted: Orders

## 2012-01-31 ENCOUNTER — Telehealth: Payer: Self-pay | Admitting: Pulmonary Disease

## 2012-01-31 NOTE — Telephone Encounter (Signed)
Pt walked in requesting results. He is in the lobby now. Tyler Barrera

## 2012-01-31 NOTE — Telephone Encounter (Signed)
Pt notified of cxr results per Dr Shelle Iron.

## 2012-01-31 NOTE — Telephone Encounter (Signed)
Since pt is in the lobby now to speak to nurse I called [name]. She asks that I call triage and ask nurses to handle this please. Tyler Barrera

## 2012-02-06 ENCOUNTER — Ambulatory Visit (INDEPENDENT_AMBULATORY_CARE_PROVIDER_SITE_OTHER): Payer: Managed Care, Other (non HMO) | Admitting: Internal Medicine

## 2012-02-06 ENCOUNTER — Encounter: Payer: Self-pay | Admitting: Internal Medicine

## 2012-02-06 VITALS — BP 136/86 | HR 84 | Temp 97.6°F | Ht 66.0 in | Wt 162.0 lb

## 2012-02-06 DIAGNOSIS — D869 Sarcoidosis, unspecified: Secondary | ICD-10-CM

## 2012-02-06 DIAGNOSIS — E876 Hypokalemia: Secondary | ICD-10-CM

## 2012-02-06 DIAGNOSIS — Z0000 Encounter for general adult medical examination without abnormal findings: Secondary | ICD-10-CM

## 2012-02-06 DIAGNOSIS — I10 Essential (primary) hypertension: Secondary | ICD-10-CM

## 2012-02-06 DIAGNOSIS — E785 Hyperlipidemia, unspecified: Secondary | ICD-10-CM

## 2012-02-06 NOTE — Progress Notes (Signed)
Subjective:    Patient ID: Tyler Barrera, male    DOB: 1961-04-21, 51 y.o.   MRN: [id number]  HPI Patient comes in today for preventive visit and follow-up of medical issues. Update  history since  last visit: No major changes in his health status he has seen Dr. Shelle Iron for his sarcoidosis his pulmonary status is stable perhaps just slightly decreased lung function on no medications to be followed yearly. Blood pressures been good as far as he knows taking medication without side effect. There is a good deal of stress related to job but he is weathering the storm. He tries to stay physically active. He is taking cholesterol medicine without side effect  No major changes in family history.  Past history family history social history reviewed in the electronic medical record. Review of Systems ROS:  GEN/ HEENT: No fever, significant weight changes sweats headaches vision problems hearing changes, CV/ PULM; No chest pain shortness of breath , syncope,edema  change in exercise tolerance. GI /GU: No adominal pain, vomiting, change in bowel habits. No blood in the stool. No significant GU symptoms. SKIN/HEME: ,no acute skin rashes suspicious lesions or bleeding. No lymphadenopathy, nodules, masses.  NEURO/ PSYCH:  No neurologic signs such as weakness numbness. No depression anxiety. IMM/ Allergy: No unusual infections.   REST of 12 system review negative except as per HPI     Objective:   Physical Exam BP 136/86  Pulse 84  Temp 97.6 F (36.4 C) (Oral)  Ht 5\' 6"  (1.676 m)  Wt 162 lb (73.483 kg)  BMI 26.15 kg/m2  SpO2 98% Physical Exam: Vital signs reviewed WUJ:WJXB is a well-developed well-nourished alert cooperative  AAmale  who appears   stated age in no acute distress.  HEENT: normocephalic  traumatic , Eyes: PERRL EOM's full, conjunctiva clear, Nares: patent no deformity discharge or tenderness., Ears: no deformity EAC's clear TMs with normal landmarks. Mouth: clear OP, no lesions,  edema.  Moist mucous membranes. Dentition in adequate repair. NECK: supple without masses, thyromegaly or bruits. CHEST/PULM:  Clear to auscultation and percussion breath sounds equal no wheeze , rales or rhonchi. No chest wall deformities or tenderness. CV: PMI is nondisplaced, S1 S2 no gallops, murmurs, rubs. Peripheral pulses are full without delay.No JVD .  ABDOMEN: Bowel sounds normal nontender  No guard or rebound, no hepato splenomegal no CVA tenderness.  No hernia. Extremtities:  No clubbing cyanosis or edema, no acute joint swelling or redness no focal atrophy NEURO:  Oriented x3, cranial nerves 3-12 appear to be intact, no obvious focal weakness,gait within normal limits no abnormal reflexes or asymmetrical SKIN: No acute rashes normal turgor, color, no bruising or petechiae. PSYCH: Oriented, good eye contact, no obvious depression anxiety, cognition and judgment appear normal. LN:  No cervical axillary or inguinal adenopathy Rectal prostate 1+ no nodules     Lab Results  Component Value Date   WBC 4.7 01/16/2012   HGB 14.9 01/16/2012   HCT 44.5 01/16/2012   PLT 214.0 01/16/2012   GLUCOSE 86 01/16/2012   CHOL 158 01/16/2012   TRIG 88.0 01/16/2012   HDL 44.80 01/16/2012   LDLDIRECT 203.0 04/22/2007   LDLCALC 96 01/16/2012   ALT 28 01/16/2012   AST 28 01/16/2012   NA 140 01/16/2012   K 3.3* 01/16/2012   CL 102 01/16/2012   CREATININE 1.1 01/16/2012   BUN 14 01/16/2012   CO2 29 01/16/2012   TSH 1.35 01/16/2012   PSA 0.49 01/16/2012  Assessment & Plan:  Preventive Health Care Counseled regarding healthy nutrition, exercise, sleep, injury prevention, calcium vit d and healthy weight . HT  Monitor usually ok  Add aerobic  Activity to other  Yoga  Low potassium  Disc lower sodium increase K indiet . Vs other change  Recheck K  in a month or as needed  Otherwise  Yearly  Lipids good  Sarcoid  thinks he has had pneumovax  To get flu shot when available

## 2012-02-06 NOTE — Patient Instructions (Addendum)
Adding some aerobic exercise can help your blood pressure and also stress and improved concentration. Avoid 8 high salt or excess sodium in your diet to help your blood pressure and avoiding low potassium.  Increase potassium rich foods. We should recheck your potassium level after implementing these changes in about a month. You do not need an office visit for this.  If this is good we can recheck in a year with full set of labs.  Foods Rich in Potassium Food / Potassium (mg)  Apricots, dried,  cup / 378 mg   Apricots, raw, 1 cup halves / 401 mg   Avocado,  / 487 mg   Banana, 1 large / 487 mg   Beef, lean, round, 3 oz / 202 mg   Cantaloupe, 1 cup cubes / 427 mg   Dates, medjool, 5 whole / 835 mg   Ham, cured, 3 oz / 212 mg   Lentils, dried,  cup / 458 mg   Lima beans, frozen,  cup / 258 mg   Orange, 1 large / 333 mg   Orange juice, 1 cup / 443 mg   Peaches, dried,  cup / 398 mg   Peas, split, cooked,  cup / 355 mg   Potato, boiled, 1 medium / 515 mg   Prunes, dried, uncooked,  cup / 318 mg   Raisins,  cup / 309 mg   Salmon, pink, raw, 3 oz / 275 mg   Sardines, canned , 3 oz / 338 mg   Tomato, raw, 1 medium / 292 mg   Tomato juice, 6 oz / 417 mg   [place], 3 oz / 349 mg  Document Released: 05/28/2005 Document Revised: 02/07/2011 Document Reviewed: 10/11/2008 [facility] Patient Information 2012 [place], [place].   GET a FLU shot when available.

## 2012-02-08 ENCOUNTER — Encounter: Payer: Self-pay | Admitting: Internal Medicine

## 2012-02-08 DIAGNOSIS — Z0000 Encounter for general adult medical examination without abnormal findings: Secondary | ICD-10-CM | POA: Insufficient documentation

## 2012-02-27 ENCOUNTER — Other Ambulatory Visit: Payer: Medicare HMO

## 2012-03-25 ENCOUNTER — Other Ambulatory Visit: Payer: Self-pay

## 2012-04-08 ENCOUNTER — Other Ambulatory Visit (INDEPENDENT_AMBULATORY_CARE_PROVIDER_SITE_OTHER): Payer: Managed Care, Other (non HMO)

## 2012-04-08 DIAGNOSIS — I10 Essential (primary) hypertension: Secondary | ICD-10-CM

## 2012-04-08 LAB — BASIC METABOLIC PANEL
BUN: 15 mg/dL (ref 6–23)
CO2: 27 mEq/L (ref 19–32)
Chloride: 103 mEq/L (ref 96–112)
Glucose, Bld: 86 mg/dL (ref 70–99)
Potassium: 3.6 mEq/L (ref 3.5–5.1)

## 2012-06-13 ENCOUNTER — Other Ambulatory Visit: Payer: Self-pay | Admitting: Internal Medicine

## 2012-07-14 ENCOUNTER — Other Ambulatory Visit: Payer: Self-pay | Admitting: Internal Medicine

## 2012-12-12 ENCOUNTER — Other Ambulatory Visit: Payer: Self-pay | Admitting: Internal Medicine

## 2012-12-19 ENCOUNTER — Other Ambulatory Visit: Payer: Self-pay | Admitting: Family Medicine

## 2013-01-28 ENCOUNTER — Ambulatory Visit: Payer: Medicare HMO | Admitting: Pulmonary Disease

## 2013-02-02 ENCOUNTER — Other Ambulatory Visit: Payer: Medicare HMO

## 2013-02-10 ENCOUNTER — Other Ambulatory Visit: Payer: Self-pay | Admitting: Internal Medicine

## 2013-02-16 ENCOUNTER — Encounter: Payer: Medicare HMO | Admitting: Internal Medicine

## 2013-03-03 ENCOUNTER — Ambulatory Visit: Payer: Self-pay | Admitting: Pulmonary Disease

## 2013-03-14 ENCOUNTER — Other Ambulatory Visit: Payer: Self-pay | Admitting: Internal Medicine

## 2013-05-01 ENCOUNTER — Other Ambulatory Visit: Payer: Self-pay

## 2013-05-08 ENCOUNTER — Encounter: Payer: Self-pay | Admitting: Internal Medicine

## 2013-05-15 ENCOUNTER — Other Ambulatory Visit: Payer: Self-pay | Admitting: Internal Medicine

## 2013-06-13 ENCOUNTER — Other Ambulatory Visit: Payer: Self-pay | Admitting: Internal Medicine

## 2013-06-17 ENCOUNTER — Other Ambulatory Visit: Payer: Self-pay | Admitting: Family Medicine

## 2013-06-17 MED ORDER — SIMVASTATIN 40 MG PO TABS: ORAL_TABLET | ORAL | Status: DC

## 2013-07-08 ENCOUNTER — Other Ambulatory Visit: Payer: Self-pay

## 2013-07-13 ENCOUNTER — Other Ambulatory Visit (INDEPENDENT_AMBULATORY_CARE_PROVIDER_SITE_OTHER): Payer: Self-pay

## 2013-07-13 DIAGNOSIS — Z0000 Encounter for general adult medical examination without abnormal findings: Secondary | ICD-10-CM

## 2013-07-13 LAB — CBC WITH DIFFERENTIAL/PLATELET
BASOS PCT: 0.4 % (ref 0.0–3.0)
Basophils Absolute: 0 10*3/uL (ref 0.0–0.1)
EOS PCT: 3.8 % (ref 0.0–5.0)
Eosinophils Absolute: 0.2 10*3/uL (ref 0.0–0.7)
HEMATOCRIT: 45 % (ref 39.0–52.0)
Hemoglobin: 14.5 g/dL (ref 13.0–17.0)
LYMPHS ABS: 1.1 10*3/uL (ref 0.7–4.0)
Lymphocytes Relative: 25.2 % (ref 12.0–46.0)
MCHC: 32.2 g/dL (ref 30.0–36.0)
MCV: 85.5 fl (ref 78.0–100.0)
MONO ABS: 0.6 10*3/uL (ref 0.1–1.0)
MONOS PCT: 14.2 % — AB (ref 3.0–12.0)
Neutro Abs: 2.5 10*3/uL (ref 1.4–7.7)
Neutrophils Relative %: 56.4 % (ref 43.0–77.0)
PLATELETS: 211 10*3/uL (ref 150.0–400.0)
RBC: 5.26 Mil/uL (ref 4.22–5.81)
RDW: 13.7 % (ref 11.5–14.6)
WBC: 4.4 10*3/uL — AB (ref 4.5–10.5)

## 2013-07-13 LAB — HEPATIC FUNCTION PANEL
ALBUMIN: 4.4 g/dL (ref 3.5–5.2)
ALK PHOS: 78 U/L (ref 39–117)
ALT: 32 U/L (ref 0–53)
AST: 36 U/L (ref 0–37)
BILIRUBIN DIRECT: 0.1 mg/dL (ref 0.0–0.3)
Total Bilirubin: 1 mg/dL (ref 0.3–1.2)
Total Protein: 7.6 g/dL (ref 6.0–8.3)

## 2013-07-13 LAB — LIPID PANEL
CHOLESTEROL: 146 mg/dL (ref 0–200)
HDL: 44 mg/dL (ref >39.00)
LDL CALC: 84 mg/dL (ref 0–99)
TRIGLYCERIDES: 91 mg/dL (ref 0.0–149.0)
Total CHOL/HDL Ratio: 3
VLDL: 18.2 mg/dL (ref 0.0–40.0)

## 2013-07-13 LAB — BASIC METABOLIC PANEL
BUN: 11 mg/dL (ref 6–23)
CHLORIDE: 103 meq/L (ref 96–112)
CO2: 29 mEq/L (ref 19–32)
Calcium: 9.3 mg/dL (ref 8.4–10.5)
Creatinine, Ser: 1.1 mg/dL (ref 0.4–1.5)
GFR: 89.94 mL/min (ref >60.00)
Glucose, Bld: 88 mg/dL (ref 70–99)
POTASSIUM: 4.1 meq/L (ref 3.5–5.1)
SODIUM: 139 meq/L (ref 135–145)

## 2013-07-13 LAB — PSA: PSA: 0.58 ng/mL (ref 0.10–4.00)

## 2013-07-13 LAB — TSH: TSH: 0.78 u[IU]/mL (ref 0.35–5.50)

## 2013-07-14 ENCOUNTER — Encounter: Payer: Self-pay | Admitting: Internal Medicine

## 2013-07-14 ENCOUNTER — Ambulatory Visit (INDEPENDENT_AMBULATORY_CARE_PROVIDER_SITE_OTHER): Payer: Self-pay | Admitting: Internal Medicine

## 2013-07-14 VITALS — BP 138/84 | HR 85 | Temp 98.3°F | Ht 65.75 in | Wt 165.0 lb

## 2013-07-14 DIAGNOSIS — Z0000 Encounter for general adult medical examination without abnormal findings: Secondary | ICD-10-CM

## 2013-07-14 DIAGNOSIS — I10 Essential (primary) hypertension: Secondary | ICD-10-CM

## 2013-07-14 DIAGNOSIS — D869 Sarcoidosis, unspecified: Secondary | ICD-10-CM

## 2013-07-14 DIAGNOSIS — E785 Hyperlipidemia, unspecified: Secondary | ICD-10-CM

## 2013-07-14 DIAGNOSIS — Z8669 Personal history of other diseases of the nervous system and sense organs: Secondary | ICD-10-CM | POA: Insufficient documentation

## 2013-07-14 MED ORDER — TRIAMTERENE-HCTZ 37.5-25 MG PO TABS: ORAL_TABLET | ORAL | Status: DC

## 2013-07-14 MED ORDER — SIMVASTATIN 40 MG PO TABS: ORAL_TABLET | ORAL | Status: DC

## 2013-07-14 NOTE — Patient Instructions (Addendum)
Continue lifestyle intervention healthy eating and exercise . 150 minutes of exercise weeks  , weight  To healthy levels. Avoid trans fats and processed foods;  Increase fresh fruits and veges to 5 servings per day. And avoid sweet beverages  Including tea and juice.  blood pressure goal below 140/90  Consider pneumonia vaccine because of your hx of sarcoid lung.  prevnar 13   pneumovax in a year .   Mediterranean diet and DASH  diet helpful.

## 2013-07-14 NOTE — Progress Notes (Signed)
Chief Complaint  Patient presents with  . Annual Exam    HPI: Patient comes in today for Preventive Health Care visit  No major changes in health since his last visit. He is taking diuretic for eye protection his blood pressure ranges in the 130/80 ranges occasionally 90. Had some stressful time in the last 6 months back on insurance for his family. No cardiovascular or pulmonary symptoms at this time. He does yearly followups with Dr. Stann Mainland for his pulmonary sarcoid. Has had some recurring problems with his low back and sciatica comes and goes. Usually does within a few weeks. No weakness or bladder changes.  No tobacco or significant ETS alcohol. Up-to-date on flu vaccine. ROS:  GEN/ HEENT: No fever, significant weight changes sweats headaches vision problems hearing changes, CV/ PULM; No chest pain shortness of breath cough, syncope,edema  change in exercise tolerance. GI /GU: No adominal pain, vomiting, change in bowel habits. No blood in the stool. No significant GU symptoms. SKIN/HEME: ,no acute skin rashes suspicious lesions or bleeding. No lymphadenopathy, nodules, masses.  NEURO/ PSYCH:  No neurologic signs such as weakness numbness. No depression anxiety. IMM/ Allergy: No unusual infections.  Allergy .   REST of 12 system review negative except as per HPI   Past Medical History  Diagnosis Date  . Hyperlipidemia   . Hypertension   . Pulmonary sarcoidosis     Dr Shelle Iron     Family History  Problem Relation Age of Onset  . Hypertension Mother   . Hyperlipidemia Mother   . Cancer Father     prostate  . Hyperlipidemia Father   . Hypertension Father     History   Social History  . Marital Status: Married    Spouse Name: N/A    Number of Children: N/A  . Years of Education: N/A   Social History Main Topics  . Smoking status: Never Smoker   . Smokeless tobacco: None  . Alcohol Use: Yes  . Drug Use: None  . Sexual Activity: None   Other Topics Concern  . None    Social History Narrative   Household of three married   Non-smoker regular exercise   [facility] hours plus   Self-employed carpet cleaning.   No pets    Son at [employer] graduated Another teen at home.   Soc etoh  caffiene tea some soda minimal   Exercises     Outpatient Encounter Prescriptions as of 07/14/2013  Medication Sig  . simvastatin (ZOCOR) 40 MG tablet take 1 tablet by mouth at bedtime  . triamterene-hydrochlorothiazide (MAXZIDE-25) 37.5-25 MG per tablet take 1 tablet by mouth once daily  . [DISCONTINUED] simvastatin (ZOCOR) 40 MG tablet take 1 tablet by mouth at bedtime  . [DISCONTINUED] triamterene-hydrochlorothiazide (MAXZIDE-25) 37.5-25 MG per tablet take 1 tablet by mouth once daily    EXAM:  BP 138/84  Pulse 85  Temp(Src) 98.3 F (36.8 C) (Oral)  Ht 5' 5.75" (1.67 m)  Wt 165 lb (74.844 kg)  BMI 26.84 kg/m2  SpO2 98%  Body mass index is 26.84 kg/(m^2).  Physical Exam: Vital signs reviewed ZOX:WRUE is a well-developed well-nourished alert cooperative  male who appears r stated age in no acute distress.  HEENT: normocephalic atraumatic , Eyes: PERRL EOM's full, conjunctiva clear, Nares: paten,t no deformity discharge or tenderness., Ears: no deformity EAC's clear TMs with normal landmarks. Mouth: clear OP, no lesions, edema.  Moist mucous membranes. Dentition in adequate repair. NECK: supple without  masses, thyromegaly or bruits. CHEST/PULM:  Clear to auscultation and percussion breath sounds equal no wheeze , rales or rhonchi. No chest wall deformities or tenderness. CV: PMI is nondisplaced, S1 S2 no gallops, murmurs, rubs. Peripheral pulses are full without delay.No JVD .  ABDOMEN: Bowel sounds normal nontender  No guard or rebound, no hepato splenomegal no CVA tenderness.  No hernia. Extremtities:  No clubbing cyanosis or edema, no acute joint swelling or redness no focal atrophygood muscle mass NEURO:  Oriented x3, cranial nerves 3-12  appear to be intact, no obvious focal weakness,gait within normal limits no abnormal reflexes or asymmetrical SKIN: No acute rashes normal turgor, color, no bruising or petechiae. PSYCH: Oriented, good eye contact, no obvious depression anxiety, cognition and judgment appear normal. LN: no cervical axillary inguinal adenopathy Rectal exam prostate +1 no nodules noted stool smear heme-negative  Lab Results  Component Value Date   WBC 4.4* 07/13/2013   HGB 14.5 07/13/2013   HCT 45.0 07/13/2013   PLT 211.0 07/13/2013   GLUCOSE 88 07/13/2013   CHOL 146 07/13/2013   TRIG 91.0 07/13/2013   HDL 44.00 07/13/2013   LDLDIRECT 203.0 04/22/2007   LDLCALC 84 07/13/2013   ALT 32 07/13/2013   AST 36 07/13/2013   NA 139 07/13/2013   K 4.1 07/13/2013   CL 103 07/13/2013   CREATININE 1.1 07/13/2013   BUN 11 07/13/2013   CO2 29 07/13/2013   TSH 0.78 07/13/2013   PSA 0.58 07/13/2013   Health Maintenance  Topic Date Due  . Influenza Vaccine  01/09/2014  . Tetanus/tdap  06/11/2016  . Colonoscopy  08/28/2020   laboratory studies reviewed with patient Health Maintenance Review   ASSESSMENT AND PLAN:  Discussed the following assessment and plan:  Visit for preventive health examination  HYPERTENSION - Repeat blood pressure is in range however if creeping up we may add other medication. Facial monitoring contact us if out of range discussed lifestyle intervent  HYPERLIPIDEMIA - No side effect of medication  PULMONARY SARCOIDOSIS - Clinically very few symptoms or none today  History of sciatica - Recurrent problem no alarm features follow up if persistent progressive consider other intervention. Discuss potential pneumococcal vaccine Prevnar 13 first and then Pneumovax he will discuss this with Dr. clamps. Patient Care Team: Madelin HeadingsWanda K Tod Abrahamsen, MD as PCP - General Barbaraann ShareKeith M Clance, MD (Pulmonary Disease) Rachael Feeaniel P Jacobs, MD (Gastroenterology) Patient Instructions  Continue lifestyle intervention healthy eating and exercise . 150  minutes of exercise weeks  , weight  To healthy levels. Avoid trans fats and processed foods;  Increase fresh fruits and veges to 5 servings per day. And avoid sweet beverages  Including tea and juice.  blood pressure goal below 140/90  Consider pneumonia vaccine because of your hx of sarcoid lung.  prevnar 13   pneumovax in a year .   Mediterranean diet and DASH  diet helpful.       Neta MendsWanda K. Jozalynn Noyce M.D.    Pre visit review using our clinic review tool, if applicable. No additional management support is needed unless otherwise documented below in the visit note.

## 2013-07-15 ENCOUNTER — Encounter: Payer: Self-pay | Admitting: Internal Medicine

## 2013-07-15 ENCOUNTER — Telehealth: Payer: Self-pay | Admitting: Internal Medicine

## 2013-07-15 NOTE — Telephone Encounter (Signed)
Relevant patient education assigned to patient using Emmi. ° °

## 2014-02-12 ENCOUNTER — Other Ambulatory Visit: Payer: Self-pay | Admitting: Pulmonary Disease

## 2014-02-12 DIAGNOSIS — R0602 Shortness of breath: Secondary | ICD-10-CM

## 2014-02-16 ENCOUNTER — Ambulatory Visit (INDEPENDENT_AMBULATORY_CARE_PROVIDER_SITE_OTHER)
Admission: RE | Admit: 2014-02-16 | Discharge: 2014-02-16 | Disposition: A | Discharge status: Home / Self Care | Payer: No Typology Code available for payment source | Source: Ambulatory Visit | Attending: Pulmonary Disease | Admitting: Pulmonary Disease

## 2014-02-16 ENCOUNTER — Encounter: Payer: Self-pay | Admitting: Pulmonary Disease

## 2014-02-16 ENCOUNTER — Ambulatory Visit (INDEPENDENT_AMBULATORY_CARE_PROVIDER_SITE_OTHER): Payer: No Typology Code available for payment source | Admitting: Pulmonary Disease

## 2014-02-16 ENCOUNTER — Encounter (INDEPENDENT_AMBULATORY_CARE_PROVIDER_SITE_OTHER): Payer: Self-pay

## 2014-02-16 VITALS — BP 132/88 | HR 82 | Temp 98.8°F | Ht 66.0 in | Wt 163.0 lb

## 2014-02-16 DIAGNOSIS — R0602 Shortness of breath: Secondary | ICD-10-CM

## 2014-02-16 DIAGNOSIS — D869 Sarcoidosis, unspecified: Secondary | ICD-10-CM

## 2014-02-16 LAB — PULMONARY FUNCTION TEST
DL/VA % PRED: 132 %
DL/VA: 5.79 ml/min/mmHg/L
DLCO unc % pred: 88 %
DLCO unc: 23.9 ml/min/mmHg
FEF 25-75 POST: 4.53 L/s
FEF 25-75 Pre: 4.03 L/sec
FEF2575-%Change-Post: 12 %
FEF2575-%PRED-PRE: 143 %
FEF2575-%Pred-Post: 161 %
FEV1-%Change-Post: 2 %
FEV1-%Pred-Post: 94 %
FEV1-%Pred-Pre: 91 %
FEV1-POST: 2.64 L
FEV1-PRE: 2.57 L
FEV1FVC-%Change-Post: 1 %
FEV1FVC-%Pred-Pre: 115 %
FEV6-%CHANGE-POST: 1 %
FEV6-%PRED-POST: 83 %
FEV6-%Pred-Pre: 82 %
FEV6-Post: 2.85 L
FEV6-Pre: 2.81 L
FEV6FVC-%Pred-Post: 103 %
FEV6FVC-%Pred-Pre: 103 %
FVC-%Change-Post: 1 %
FVC-%Pred-Post: 80 %
FVC-%Pred-Pre: 79 %
FVC-POST: 2.85 L
FVC-Pre: 2.81 L
POST FEV1/FVC RATIO: 92 %
Post FEV6/FVC ratio: 100 %
Pre FEV1/FVC ratio: 91 %
Pre FEV6/FVC Ratio: 100 %
RV % pred: 58 %
RV: 1.12 L
TLC % pred: 67 %
TLC: 4.18 L

## 2014-02-16 NOTE — Patient Instructions (Signed)
Continue with your exercise program. Please call if having increased symptoms as we discussed. followup with me in one year, and will do chest xray same day.

## 2014-02-16 NOTE — Assessment & Plan Note (Signed)
The patient has done well since the last visit a year ago, with a stable chest x-ray and improved pulmonary function studies. He leads a very active lifestyle, and participates in regular exercise. He has seen no difference in his exercise tolerance or endurance. I have explained to him again that we usually do not treat sarcoid unless there are ongoing symptoms or evidence for end organ dysfunction. He will need to be seen on a yearly basis, with a chest x-ray, and PFTs every other year as long as he is not symptomatic.

## 2014-02-16 NOTE — Progress Notes (Signed)
   Subjective:    Patient ID: Tyler Barrera, male    DOB: 1960/08/31, 53 y.o.   MRN: [id number]  HPI Patient comes in today for followup of his known sarcoidosis. He feels that his breathing is excellent and at baseline, and denies any significant cough. He has had a chest x-ray today that is stable, and his pulmonary function studies actually showed improvement in his total lung capacity and diffusion capacity from the visit last year. I have reviewed all of the testing with him, and answered all of his questions.   Review of Systems  Constitutional: Negative for fever and unexpected weight change.  HENT: Negative for congestion, dental problem, ear pain, nosebleeds, postnasal drip, rhinorrhea, sinus pressure, sneezing, sore throat and trouble swallowing.   Eyes: Negative for redness and itching.  Respiratory: Negative for cough, chest tightness, shortness of breath and wheezing.   Cardiovascular: Negative for palpitations and leg swelling.  Gastrointestinal: Negative for nausea and vomiting.  Genitourinary: Negative for dysuria.  Musculoskeletal: Negative for joint swelling.  Skin: Negative for rash.  Neurological: Negative for headaches.  Hematological: Does not bruise/bleed easily.  Psychiatric/Behavioral: Negative for dysphoric mood. The patient is not nervous/anxious.        Objective:   Physical Exam Well-developed male in no acute distress Nose without purulence or discharge noted Neck without lymphadenopathy or thyromegaly Chest with one isolated crackles in the right upper, otherwise clear Cardiac exam with regular rate and rhythm Lower extremities without edema, no cyanosis Alert and oriented, moves all 4 extremities.       Assessment & Plan:

## 2014-02-16 NOTE — Progress Notes (Signed)
PFT done today. 

## 2014-07-24 ENCOUNTER — Other Ambulatory Visit: Payer: Self-pay | Admitting: Internal Medicine

## 2014-07-26 ENCOUNTER — Telehealth: Payer: Self-pay | Admitting: Family Medicine

## 2014-07-26 ENCOUNTER — Other Ambulatory Visit: Payer: Self-pay | Admitting: Family Medicine

## 2014-07-26 DIAGNOSIS — Z0000 Encounter for general adult medical examination without abnormal findings: Secondary | ICD-10-CM

## 2014-07-26 NOTE — Telephone Encounter (Signed)
30 day supply sent to the pharmacy.  The pt is past due for his yearly visit.  Will send a message to scheduling.

## 2014-07-26 NOTE — Telephone Encounter (Signed)
This patient is past due for his CPX and lab appt.  I have placed the lab work orders in the system.  Please help the pt to make both appointments.  Thanks!

## 2014-07-27 NOTE — Telephone Encounter (Signed)
Pt has been sch

## 2014-08-23 ENCOUNTER — Telehealth: Payer: Self-pay | Admitting: Internal Medicine

## 2014-08-23 NOTE — Telephone Encounter (Signed)
Pt has cpe on 4/11 and is out of triamterene-hydrochlorothiazide (MAXZIDE-25) 37.5-25 MG per tablet and simvastatin (ZOCOR) 40 MG tablet Pt needs 30 day to get him through to appt [facility]

## 2014-08-24 MED ORDER — SIMVASTATIN 40 MG PO TABS: 40.0000 mg | ORAL_TABLET | Freq: Every day | ORAL | Status: DC

## 2014-08-24 MED ORDER — TRIAMTERENE-HCTZ 37.5-25 MG PO TABS: 1.0000 | ORAL_TABLET | Freq: Every day | ORAL | Status: DC

## 2014-08-24 NOTE — Telephone Encounter (Signed)
Sent to the pharmacy by e-scribe. 

## 2014-09-13 ENCOUNTER — Other Ambulatory Visit (INDEPENDENT_AMBULATORY_CARE_PROVIDER_SITE_OTHER): Payer: No Typology Code available for payment source

## 2014-09-13 DIAGNOSIS — Z0000 Encounter for general adult medical examination without abnormal findings: Secondary | ICD-10-CM | POA: Diagnosis not present

## 2014-09-13 LAB — PSA: PSA: 0.52 ng/mL (ref 0.10–4.00)

## 2014-09-13 LAB — LIPID PANEL
CHOLESTEROL: 155 mg/dL (ref 0–200)
HDL: 45.8 mg/dL (ref >39.00)
LDL CALC: 92 mg/dL (ref 0–99)
NonHDL: 109.2
TRIGLYCERIDES: 86 mg/dL (ref 0.0–149.0)
Total CHOL/HDL Ratio: 3
VLDL: 17.2 mg/dL (ref 0.0–40.0)

## 2014-09-13 LAB — CBC WITH DIFFERENTIAL/PLATELET
Basophils Absolute: 0 10*3/uL (ref 0.0–0.1)
Basophils Relative: 0.3 % (ref 0.0–3.0)
EOS ABS: 0.3 10*3/uL (ref 0.0–0.7)
Eosinophils Relative: 7 % — ABNORMAL HIGH (ref 0.0–5.0)
HEMATOCRIT: 46.6 % (ref 39.0–52.0)
HEMOGLOBIN: 15.4 g/dL (ref 13.0–17.0)
LYMPHS ABS: 1.3 10*3/uL (ref 0.7–4.0)
Lymphocytes Relative: 28.7 % (ref 12.0–46.0)
MCHC: 33 g/dL (ref 30.0–36.0)
MCV: 81.9 fl (ref 78.0–100.0)
Monocytes Absolute: 0.6 10*3/uL (ref 0.1–1.0)
Monocytes Relative: 13.2 % — ABNORMAL HIGH (ref 3.0–12.0)
NEUTROS ABS: 2.4 10*3/uL (ref 1.4–7.7)
Neutrophils Relative %: 50.8 % (ref 43.0–77.0)
Platelets: 229 10*3/uL (ref 150.0–400.0)
RBC: 5.69 Mil/uL (ref 4.22–5.81)
RDW: 13.9 % (ref 11.5–15.5)
WBC: 4.7 10*3/uL (ref 4.0–10.5)

## 2014-09-13 LAB — BASIC METABOLIC PANEL
BUN: 18 mg/dL (ref 6–23)
CALCIUM: 9.9 mg/dL (ref 8.4–10.5)
CO2: 29 meq/L (ref 19–32)
CREATININE: 1.13 mg/dL (ref 0.40–1.50)
Chloride: 103 mEq/L (ref 96–112)
GFR: 86.81 mL/min (ref >60.00)
GLUCOSE: 96 mg/dL (ref 70–99)
Potassium: 4.2 mEq/L (ref 3.5–5.1)
SODIUM: 142 meq/L (ref 135–145)

## 2014-09-13 LAB — HEPATIC FUNCTION PANEL
ALT: 29 U/L (ref 0–53)
AST: 25 U/L (ref 0–37)
Albumin: 4.8 g/dL (ref 3.5–5.2)
Alkaline Phosphatase: 88 U/L (ref 39–117)
BILIRUBIN DIRECT: 0.2 mg/dL (ref 0.0–0.3)
Total Bilirubin: 0.7 mg/dL (ref 0.2–1.2)
Total Protein: 8.1 g/dL (ref 6.0–8.3)

## 2014-09-13 LAB — TSH: TSH: 0.74 u[IU]/mL (ref 0.35–4.50)

## 2014-09-20 ENCOUNTER — Encounter: Payer: Self-pay | Admitting: Internal Medicine

## 2014-09-20 ENCOUNTER — Ambulatory Visit (INDEPENDENT_AMBULATORY_CARE_PROVIDER_SITE_OTHER): Payer: No Typology Code available for payment source | Admitting: Internal Medicine

## 2014-09-20 VITALS — BP 136/78 | Temp 98.1°F | Ht 66.0 in | Wt 165.0 lb

## 2014-09-20 DIAGNOSIS — D869 Sarcoidosis, unspecified: Secondary | ICD-10-CM | POA: Diagnosis not present

## 2014-09-20 DIAGNOSIS — Z0000 Encounter for general adult medical examination without abnormal findings: Secondary | ICD-10-CM

## 2014-09-20 DIAGNOSIS — E785 Hyperlipidemia, unspecified: Secondary | ICD-10-CM

## 2014-09-20 DIAGNOSIS — I10 Essential (primary) hypertension: Secondary | ICD-10-CM

## 2014-09-20 NOTE — Progress Notes (Signed)
Pre visit review using our clinic review tool, if applicable. No additional management support is needed unless otherwise documented below in the visit note.  Chief Complaint  Patient presents with  . Annual Exam    HPI: Patient  Tyler Barrera  54 y.o. comes in today for Preventive Health Care visit  No major changes in health  BPocass checking  About  125- 135/  77-90  Lipid med no se of med asks about grapefruit Trying to do healthy but  Not exercising recently  Health Maintenance  Topic Date Due  . HIV Screening  09/12/2015 (Originally 03/18/1976)  . INFLUENZA VACCINE  01/10/2015  . TETANUS/TDAP  06/11/2016  . COLONOSCOPY  08/28/2020   Health Maintenance Review LIFESTYLE:  Exercise:   Signed up gym  Not a lot . Yoga  Tobacco/ETS:   no Alcohol: wine 2 x per month Sugar beverages: 1-2 x per week tea and soda.  Sleep:    6-8  Drug use: no Colonoscopy:  Utd.  ROS:  GEN/ HEENT: No fever, significant weight changes sweats headaches vision problems hearing changes, CV/ PULM; No chest pain shortness of breath cough, syncope,edema  change in exercise tolerance. GI /GU: No adominal pain, vomiting, change in bowel habits. No blood in the stool. No significant GU symptoms. SKIN/HEME: ,no acute skin rashes suspicious lesions or bleeding. No lymphadenopathy, nodules, masses.  NEURO/ PSYCH:  No neurologic signs such as weakness numbness. No depression anxiety. IMM/ Allergy: No unusual infections.  Allergy .   REST of 12 system review negative except as per HPI   Past Medical History  Diagnosis Date  . Hyperlipidemia   . Hypertension   . Pulmonary sarcoidosis     Dr Shelle Iron     Past Surgical History  Procedure Laterality Date  . Bronchoscopy      Family History  Problem Relation Age of Onset  . Hypertension Mother   . Hyperlipidemia Mother   . Cancer Father     prostate  . Hyperlipidemia Father   . Hypertension Father     History   Social History  . Marital Status:  Married    Spouse Name: N/A  . Number of Children: N/A  . Years of Education: N/A   Social History Main Topics  . Smoking status: Never Smoker   . Smokeless tobacco: Not on file  . Alcohol Use: Yes  . Drug Use: Not on file  . Sexual Activity: Not on file   Other Topics Concern  . None   Social History Narrative   Household of three married   Non-smoker regular exercise   [facility] hours plus   Self-employed carpet cleaning.   No pets    Son at [employer] graduated Another teen at home.   Soc etoh  caffiene tea some soda minimal   Exercises     Outpatient Encounter Prescriptions as of 09/20/2014  Medication Sig  . simvastatin (ZOCOR) 40 MG tablet Take 1 tablet (40 mg total) by mouth at bedtime.  . triamterene-hydrochlorothiazide (MAXZIDE-25) 37.5-25 MG per tablet Take 1 tablet by mouth daily.    EXAM:  BP 136/78 mmHg  Temp(Src) 98.1 F (36.7 C) (Oral)  Ht  (1.676 m)  Wt 165 lb (74.844 kg)  BMI 26.64 kg/m2  Body mass index is 26.64 kg/(m^2).  Physical Exam: Vital signs reviewed ZOX:WRUE is a well-developed well-nourished alert cooperative    who appearsr stated age in no acute distress.  HEENT: normocephalic atraumatic ,  Eyes: PERRL EOM's full, conjunctiva clear, Nares: paten,t no deformity discharge or tenderness., Ears: no deformity EAC's clear TMs with normal landmarks. Mouth: clear OP, no lesions, edema.  Moist mucous membranes. Dentition in adequate repair. NECK: supple without masses, thyromegaly or bruits. CHEST/PULM:  Clear to auscultation and percussion breath sounds equal no wheeze , rales or rhonchi. No chest wall deformities or tenderness. CV: PMI is nondisplaced, S1 S2 no gallops, murmurs, rubs. Peripheral pulses are full without delay.No JVD .  ABDOMEN: Bowel sounds normal nontender  No guard or rebound, no hepato splenomegal no CVA tenderness.   Extremtities:  No clubbing cyanosis or edema, no acute joint swelling or redness no  focal atrophy NEURO:  Oriented x3, cranial nerves 3-12 appear to be intact, no obvious focal weakness,gait within normal limits no abnormal reflexes or asymmetrical SKIN: No acute rashes normal turgor, color, no bruising or petechiae. PSYCH: Oriented, good eye contact, no obvious depression anxiety, cognition and judgment appear normal. LN: no cervical axillary inguinal adenopathy  Lab Results  Component Value Date   WBC 4.7 09/13/2014   HGB 15.4 09/13/2014   HCT 46.6 09/13/2014   PLT 229.0 09/13/2014   GLUCOSE 96 09/13/2014   CHOL 155 09/13/2014   TRIG 86.0 09/13/2014   HDL 45.80 09/13/2014   LDLDIRECT 203.0 04/22/2007   LDLCALC 92 09/13/2014   ALT 29 09/13/2014   AST 25 09/13/2014   NA 142 09/13/2014   K 4.2 09/13/2014   CL 103 09/13/2014   CREATININE 1.13 09/13/2014   BUN 18 09/13/2014   CO2 29 09/13/2014   TSH 0.74 09/13/2014   PSA 0.52 09/13/2014   Wt Readings from Last 3 Encounters:  09/20/14 165 lb (74.844 kg)  02/16/14 163 lb (73.936 kg)  07/14/13 165 lb (74.844 kg)    ASSESSMENT AND PLAN:  Discussed the following assessment and plan:  Visit for preventive health examination - utd   Essential hypertension - better on repeat  control   Hyperlipidemia - at goal   PULMONARY SARCOIDOSIS - stab le quiescent follow qo year dr Shelle Iron  Counseled regarding healthy nutrition, exercise, sleep, injury prevention, and healthy weight .  Patient Care Team: Madelin Headings, MD as PCP - General Barbaraann Share, MD (Pulmonary Disease) Rachael Fee, MD (Gastroenterology) Patient Instructions    Continue lifestyle intervention healthy eating and exercise . Healthy lifestyle includes : At least 150 minutes of exercise weeks  , weight at healthy levels, which is usually   BMI 19-25. Avoid trans fats and processed foods;  Increase fresh fruits and veges to 5 servings per day. And avoid sweet beverages including tea and juice. Mediterranean diet with olive oil and nuts  have been noted to be heart and brain healthy . Avoid tobacco products . Limit  alcohol to  7 per week for women and 14 servings for men.  Blood pressure below 140/90 Get adequate sleep . Wear seat belts . Don't text and drive .      Why follow it? Research shows. . Those who follow the Mediterranean diet have a reduced risk of heart disease  . The diet is associated with a reduced incidence of Parkinson's and Alzheimer's diseases . People following the diet may have longer life expectancies and lower rates of chronic diseases  . The Dietary Guidelines for Americans recommends the Mediterranean diet as an eating plan to promote health and prevent disease  What Is the Mediterranean Diet?  . Healthy eating plan based on typical foods  and recipes of Mediterranean-style cooking . The diet is primarily a plant based diet; these foods should make up a majority of meals   Starches - Plant based foods should make up a majority of meals - They are an important sources of vitamins, minerals, energy, antioxidants, and fiber - Choose whole grains, foods high in fiber and minimally processed items  - Typical grain sources include wheat, oats, barley, corn, brown rice, bulgar, farro, millet, polenta, couscous  - Various types of beans include chickpeas, lentils, fava beans, black beans, white beans   Fruits  Veggies - Large quantities of antioxidant rich fruits & veggies; 6 or more servings  - Vegetables can be eaten raw or lightly drizzled with oil and cooked  - Vegetables common to the traditional Mediterranean Diet include: artichokes, arugula, beets, broccoli, brussel sprouts, cabbage, carrots, celery, collard greens, cucumbers, eggplant, kale, leeks, lemons, lettuce, mushrooms, okra, onions, peas, peppers, potatoes, pumpkin, radishes, rutabaga, shallots, spinach, sweet potatoes, turnips, zucchini - Fruits common to the Mediterranean Diet include: apples, apricots, avocados, cherries, clementines,  dates, figs, grapefruits, grapes, melons, nectarines, oranges, peaches, pears, pomegranates, strawberries, tangerines  Fats - Replace butter and margarine with healthy oils, such as olive oil, canola oil, and tahini  - Limit nuts to no more than a handful a day  - Nuts include walnuts, almonds, pecans, pistachios, pine nuts  - Limit or avoid candied, honey roasted or heavily salted nuts - Olives are central to the [employer] - can be eaten whole or used in a variety of dishes   Meats Protein - Limiting red meat: no more than a few times a month - When eating red meat: choose lean cuts and keep the portion to the size of deck of cards - Eggs: approx. 0 to 4 times a week  - Fish and lean poultry: at least 2 a week  - Healthy protein sources include, chicken, [place], lean beef, lamb - Increase intake of seafood such as tuna, salmon, trout, mackerel, shrimp, scallops - Avoid or limit high fat processed meats such as sausage and bacon  Dairy - Include moderate amounts of low fat dairy products  - Focus on healthy dairy such as fat free yogurt, skim milk, low or reduced fat cheese - Limit dairy products higher in fat such as whole or 2% milk, cheese, ice cream  Alcohol - Moderate amounts of red wine is ok  - No more than 5 oz daily for women (all ages) and men older than age 68  - No more than 10 oz of wine daily for men younger than 96  Other - Limit sweets and other desserts  - Use herbs and spices instead of salt to flavor foods  - Herbs and spices common to the traditional Mediterranean Diet include: basil, bay leaves, chives, cloves, cumin, fennel, garlic, lavender, marjoram, mint, oregano, parsley, pepper, rosemary, sage, savory, sumac, tarragon, thyme   It's not just a diet, it's a lifestyle:  . The Mediterranean diet includes lifestyle factors typical of those in the region  . Foods, drinks and meals are best eaten with others and savored . Daily physical activity is important for  overall good health . This could be strenuous exercise like running and aerobics . This could also be more leisurely activities such as walking, housework, yard-work, or taking the stairs . Moderation is the key; a balanced and healthy diet accommodates most foods and drinks . Consider portion sizes and frequency of consumption of certain foods  Meal Ideas & Options:  . Breakfast:  o Whole wheat toast or whole wheat English muffins with peanut butter & hard boiled egg o Steel cut oats topped with apples & cinnamon and skim milk  o Fresh fruit: banana, strawberries, melon, berries, peaches  o Smoothies: strawberries, bananas, greek yogurt, peanut butter o Low fat greek yogurt with blueberries and granola  o Egg white omelet with spinach and mushrooms o Breakfast couscous: whole wheat couscous, apricots, skim milk, cranberries  . Sandwiches:  o Hummus and grilled vegetables (peppers, zucchini, squash) on whole wheat bread   o Grilled chicken on whole wheat pita with lettuce, tomatoes, cucumbers or tzatziki  o Tuna salad on whole wheat bread: tuna salad made with greek yogurt, olives, red peppers, capers, green onions o Garlic rosemary lamb pita: lamb sauted with garlic, rosemary, salt & pepper; add lettuce, cucumber, greek yogurt to pita - flavor with lemon juice and black pepper  . Seafood:  o Mediterranean grilled salmon, seasoned with garlic, basil, parsley, lemon juice and black pepper o Shrimp, lemon, and spinach whole-grain pasta salad made with low fat greek yogurt  o Seared scallops with lemon orzo  o Seared tuna steaks seasoned salt, pepper, coriander topped with tomato mixture of olives, tomatoes, olive oil, minced garlic, parsley, green onions and cappers  . Meats:  o Herbed greek chicken salad with kalamata olives, cucumber, feta  o Red bell peppers stuffed with spinach, bulgur, lean ground beef (or lentils) & topped with feta   o Kebabs: skewers of chicken, tomatoes, onions,  zucchini, squash  o [place] burgers: made with red onions, mint, dill, lemon juice, feta cheese topped with roasted red peppers . Vegetarian o Cucumber salad: cucumbers, artichoke hearts, celery, red onion, feta cheese, tossed in olive oil & lemon juice  o Hummus and whole grain pita points with a greek salad (lettuce, tomato, feta, olives, cucumbers, red onion) o Lentil soup with celery, carrots made with vegetable broth, garlic, salt and pepper  o Tabouli salad: parsley, bulgur, mint, scallions, cucumbers, tomato, radishes, lemon juice, olive oil, salt and pepper.         Neta MendsWanda K. Calahan Pak M.D.

## 2014-09-20 NOTE — Patient Instructions (Signed)
Continue lifestyle intervention healthy eating and exercise . Healthy lifestyle includes : At least 150 minutes of exercise weeks  , weight at healthy levels, which is usually   BMI 19-25. Avoid trans fats and processed foods;  Increase fresh fruits and veges to 5 servings per day. And avoid sweet beverages including tea and juice. Mediterranean diet with olive oil and nuts have been noted to be heart and brain healthy . Avoid tobacco products . Limit  alcohol to  7 per week for women and 14 servings for men.  Blood pressure below 140/90 Get adequate sleep . Wear seat belts . Don't text and drive .      Why follow it? Research shows. . Those who follow the Mediterranean diet have a reduced risk of heart disease  . The diet is associated with a reduced incidence of Parkinson's and Alzheimer's diseases . People following the diet may have longer life expectancies and lower rates of chronic diseases  . The Dietary Guidelines for Americans recommends the Mediterranean diet as an eating plan to promote health and prevent disease  What Is the Mediterranean Diet?  . Healthy eating plan based on typical foods and recipes of Mediterranean-style cooking . The diet is primarily a plant based diet; these foods should make up a majority of meals   Starches - Plant based foods should make up a majority of meals - They are an important sources of vitamins, minerals, energy, antioxidants, and fiber - Choose whole grains, foods high in fiber and minimally processed items  - Typical grain sources include wheat, oats, barley, corn, brown rice, bulgar, farro, millet, polenta, couscous  - Various types of beans include chickpeas, lentils, fava beans, black beans, white beans   Fruits  Veggies - Large quantities of antioxidant rich fruits & veggies; 6 or more servings  - Vegetables can be eaten raw or lightly drizzled with oil and cooked  - Vegetables common to the traditional Mediterranean Diet include:  artichokes, arugula, beets, broccoli, brussel sprouts, cabbage, carrots, celery, collard greens, cucumbers, eggplant, kale, leeks, lemons, lettuce, mushrooms, okra, onions, peas, peppers, potatoes, pumpkin, radishes, rutabaga, shallots, spinach, sweet potatoes, turnips, zucchini - Fruits common to the Mediterranean Diet include: apples, apricots, avocados, cherries, clementines, dates, figs, grapefruits, grapes, melons, nectarines, oranges, peaches, pears, pomegranates, strawberries, tangerines  Fats - Replace butter and margarine with healthy oils, such as olive oil, canola oil, and tahini  - Limit nuts to no more than a handful a day  - Nuts include walnuts, almonds, pecans, pistachios, pine nuts  - Limit or avoid candied, honey roasted or heavily salted nuts - Olives are central to the [employer] - can be eaten whole or used in a variety of dishes   Meats Protein - Limiting red meat: no more than a few times a month - When eating red meat: choose lean cuts and keep the portion to the size of deck of cards - Eggs: approx. 0 to 4 times a week  - Fish and lean poultry: at least 2 a week  - Healthy protein sources include, chicken, [place], lean beef, lamb - Increase intake of seafood such as tuna, salmon, trout, mackerel, shrimp, scallops - Avoid or limit high fat processed meats such as sausage and bacon  Dairy - Include moderate amounts of low fat dairy products  - Focus on healthy dairy such as fat free yogurt, skim milk, low or reduced fat cheese - Limit dairy products higher in fat such as whole or  2% milk, cheese, ice cream  Alcohol - Moderate amounts of red wine is ok  - No more than 5 oz daily for women (all ages) and men older than age 55  - No more than 10 oz of wine daily for men younger than 365  Other - Limit sweets and other desserts  - Use herbs and spices instead of salt to flavor foods  - Herbs and spices common to the traditional Mediterranean Diet include: basil, bay  leaves, chives, cloves, cumin, fennel, garlic, lavender, marjoram, mint, oregano, parsley, pepper, rosemary, sage, savory, sumac, tarragon, thyme   It's not just a diet, it's a lifestyle:  . The Mediterranean diet includes lifestyle factors typical of those in the region  . Foods, drinks and meals are best eaten with others and savored . Daily physical activity is important for overall good health . This could be strenuous exercise like running and aerobics . This could also be more leisurely activities such as walking, housework, yard-work, or taking the stairs . Moderation is the key; a balanced and healthy diet accommodates most foods and drinks . Consider portion sizes and frequency of consumption of certain foods   Meal Ideas & Options:  . Breakfast:  o Whole wheat toast or whole wheat English muffins with peanut butter & hard boiled egg o Steel cut oats topped with apples & cinnamon and skim milk  o Fresh fruit: banana, strawberries, melon, berries, peaches  o Smoothies: strawberries, bananas, greek yogurt, peanut butter o Low fat greek yogurt with blueberries and granola  o Egg white omelet with spinach and mushrooms o Breakfast couscous: whole wheat couscous, apricots, skim milk, cranberries  . Sandwiches:  o Hummus and grilled vegetables (peppers, zucchini, squash) on whole wheat bread   o Grilled chicken on whole wheat pita with lettuce, tomatoes, cucumbers or tzatziki  o Tuna salad on whole wheat bread: tuna salad made with greek yogurt, olives, red peppers, capers, green onions o Garlic rosemary lamb pita: lamb sauted with garlic, rosemary, salt & pepper; add lettuce, cucumber, greek yogurt to pita - flavor with lemon juice and black pepper  . Seafood:  o Mediterranean grilled salmon, seasoned with garlic, basil, parsley, lemon juice and black pepper o Shrimp, lemon, and spinach whole-grain pasta salad made with low fat greek yogurt  o Seared scallops with lemon orzo   o Seared tuna steaks seasoned salt, pepper, coriander topped with tomato mixture of olives, tomatoes, olive oil, minced garlic, parsley, green onions and cappers  . Meats:  o Herbed greek chicken salad with kalamata olives, cucumber, feta  o Red bell peppers stuffed with spinach, bulgur, lean ground beef (or lentils) & topped with feta   o Kebabs: skewers of chicken, tomatoes, onions, zucchini, squash  o [place] burgers: made with red onions, mint, dill, lemon juice, feta cheese topped with roasted red peppers . Vegetarian o Cucumber salad: cucumbers, artichoke hearts, celery, red onion, feta cheese, tossed in olive oil & lemon juice  o Hummus and whole grain pita points with a greek salad (lettuce, tomato, feta, olives, cucumbers, red onion) o Lentil soup with celery, carrots made with vegetable broth, garlic, salt and pepper  o Tabouli salad: parsley, bulgur, mint, scallions, cucumbers, tomato, radishes, lemon juice, olive oil, salt and pepper.

## 2014-09-23 ENCOUNTER — Other Ambulatory Visit: Payer: Self-pay | Admitting: Internal Medicine

## 2014-09-24 ENCOUNTER — Other Ambulatory Visit: Payer: Self-pay | Admitting: *Deleted

## 2014-09-24 MED ORDER — TRIAMTERENE-HCTZ 37.5-25 MG PO TABS: 1.0000 | ORAL_TABLET | Freq: Every day | ORAL | Status: DC

## 2014-09-24 MED ORDER — SIMVASTATIN 40 MG PO TABS: 40.0000 mg | ORAL_TABLET | Freq: Every day | ORAL | Status: DC

## 2014-09-24 NOTE — Telephone Encounter (Signed)
Sent to the pharmacy by e-scribe. 

## 2015-02-18 ENCOUNTER — Ambulatory Visit: Payer: No Typology Code available for payment source | Admitting: Pulmonary Disease

## 2015-02-18 ENCOUNTER — Ambulatory Visit: Payer: No Typology Code available for payment source | Admitting: Internal Medicine

## 2015-03-31 ENCOUNTER — Telehealth: Payer: Self-pay | Admitting: Pulmonary Disease

## 2015-03-31 NOTE — Telephone Encounter (Signed)
PFT 02/16/14: FVC 2.81 L (79%) FEV1 2.57 L (91%) FEV1/FVC 0.91 FEF 25-75 4.03 L (143%) no bronchodilator response TLC 4.18 L (67%) RV 58%                  DLCO uncorrected 80% 12/19/10: FVC 2.94 L (66%) FEV1 2.47 L (75%) FEV1/FVC 0.84 FEF 25-75 3.34 L (98%) no bronchodilator response TLC 4.34 L (71%) RV 68% ERV 56% DLCO uncorrected 84%  IMAGING CXR PA/LAT 02/16/14 (personally reviewed by me): Prominent bilateral interstitial markings. No pleural effusion or thickening. Low lung volumes. Heart normal in size. Mediastinum otherwise normal in contour.  CT CHEST W/O 3/30/7 (personally reviewed by me): No pleural effusion or thickening. No pericardial effusion. Bulky mediastinal & hilar lymphadenopathy. Bilateral parenchymal nodules right greater than left with an atypical predominance. Some areas of confluence and opacification within right upper lobe as well.  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 No necrosis. Stains for fungus & AFB negative.  BRONCHIAL WASH (4/11/7): Negative for malignancy.  LABS 09/13/14 CBC: 4.7/15.4/46.6/229 BMP: 142/4.2/103/29/18/1.13/96/9.9 LFT: 4.8/8.1/0.7/88/25/29

## 2015-04-01 ENCOUNTER — Ambulatory Visit (INDEPENDENT_AMBULATORY_CARE_PROVIDER_SITE_OTHER): Payer: BC Managed Care – PPO | Admitting: Pulmonary Disease

## 2015-04-01 ENCOUNTER — Encounter: Payer: Self-pay | Admitting: Pulmonary Disease

## 2015-04-01 VITALS — BP 132/84 | HR 78 | Ht 67.0 in | Wt 164.0 lb

## 2015-04-01 DIAGNOSIS — D869 Sarcoidosis, unspecified: Secondary | ICD-10-CM

## 2015-04-01 NOTE — Progress Notes (Signed)
Subjective:    Patient ID: Tyler Barrera, male    DOB: 10/07/1960, 54 y.o.   MRN: [id number]  C.C.:  Follow-up for Sarcoidosis.  HPI Sarcoidosis:  He has never been treated. No organ involvement outside of lungs. He reports he has an opthalmology appointment yearly. No palpitations. No syncope or near syncope. No rashes or bruising. No eye swelling, pain, or erythema. No coughing. No dyspnea.   Review of Systems No chest pain or pressure. No abdominal pain, nausea, emesis, or diarrhea.  Allergies  Allergen Reactions  . Prednisone     Nausea, "Out of it" . Note: He was also on Vicodin simultaneously    Current Outpatient Prescriptions on File Prior to Visit  Medication Sig Dispense Refill  . simvastatin (ZOCOR) 40 MG tablet Take 1 tablet (40 mg total) by mouth at bedtime. 30 tablet 11  . triamterene-hydrochlorothiazide (MAXZIDE-25) 37.5-25 MG per tablet Take 1 tablet by mouth daily. 30 tablet 11   No current facility-administered medications on file prior to visit.    Past Medical History  Diagnosis Date  . Hyperlipidemia   . Hypertension   . Pulmonary sarcoidosis (HCC)     Dr Shelle Ironlance     Past Surgical History  Procedure Laterality Date  . Bronchoscopy      Family History  Problem Relation Age of Onset  . Hypertension Mother   . Hyperlipidemia Mother   . Hyperlipidemia Father   . Hypertension Father   . Prostate cancer Father   . Kidney failure Father   . Sarcoidosis Neg Hx   . Rheumatologic disease Neg Hx   . Lung disease Neg Hx     Social History   Social History  . Marital Status: Married    Spouse Name: N/A  . Number of Children: N/A  . Years of Education: N/A   Social History Main Topics  . Smoking status: Never Smoker   . Smokeless tobacco: None  . Alcohol Use: 0.0 oz/week    0 Standard drinks or equivalent per week     Comment: Occasional glass of wine.  . Drug Use: No  . Sexual Activity: Not Asked   Other Topics Concern  . None   Social  History Narrative   Household of three married   Non-smoker regular exercise   [facility] plus   Self-employed carpet cleaning.   No pets    Son at [employer] [place] graduated Another teen at home.   Soc etoh  caffiene tea some soda minimal   Exercises       [facility]:   Originally from [place]. Always lived in [place]. Prior travel to [place], [place], [place], [place], [place], & [place]. He feels he has been to pretty much the entire continental [place] state. No international travel. Currently works cleaning carpets. No pets currently. No bid exposure. No mold or hot tub exposure.       Objective:   Physical Exam Blood pressure 132/84, pulse 78, height 5\' 7"  (1.702 m), weight 164 lb (74.39 kg), SpO2 98 %. General:  Physically fit male. No distress. Alert. Integument:  Warm & dry. No rash on exposed skin. No bruising. HEENT:  Moist mucus membranes. No oral ulcers. No nasal turbinate swelling. No scleral injection Cardiovascular:  Regular rate & rhythm. No edema.   Pulmonary:  Good aeration & clear to auscultation bilaterally. Symmetric chest wall expansion. No accessory muscle use. Abdomen: Soft. Normal bowel sounds. Nondistended. Grossly nontender. Musculoskeletal:  Normal bulk and  tone. Hand grip strength 5/5 bilaterally. No joint deformity or effusion appreciated.  PFT 02/16/14: FVC 2.81 L (79%) FEV1 2.57 L (91%) FEV1/FVC 0.91 FEF 25-75 4.03 L (143%) no bronchodilator response TLC 4.18 L (67%) RV 58% DLCO uncorrected 80% 12/19/10: FVC 2.94 L (66%) FEV1 2.47 L (75%) FEV1/FVC 0.84 FEF 25-75 3.34 L (98%) no bronchodilator response TLC 4.34 L (71%) RV 68% ERV 56% DLCO uncorrected 84%  IMAGING CXR PA/LAT 02/16/14 (personally reviewed by me): Prominent bilateral interstitial markings. No pleural effusion or thickening. Low lung volumes. Heart normal in size. Mediastinum otherwise normal in contour.  CT CHEST W/O 3/30/7 (personally reviewed by me): No pleural effusion or thickening. No  pericardial effusion. Bulky mediastinal & hilar lymphadenopathy. Bilateral parenchymal nodules right greater than left with an atypical predominance. Some areas of confluence and opacification within right upper lobe as well.  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 No necrosis. Stains for fungus & AFB negative.  BRONCHIAL WASH (4/11/7): Negative for malignancy.  LABS 09/13/14 CBC: 4.7/15.4/46.6/229 BMP: 142/4.2/103/29/18/1.13/96/9.9 LFT: 4.8/8.1/0.7/88/25/29    Assessment & Plan:  54 year old male with underlying sarcoidosis. Patient currently is asymptomatic. I instructed the patient that he should notify me for any new difficulty breathing or symptoms that could be related to his sarcoidosis with its multiple manifestations. I reviewed the patient's prior chest imaging as well as pulmonary function test results. He is already scheduled for repeat blood work on 09/19/15.  1. Sarcoidosis: Asymptomatic. Plan to repeat full pulmonary function testing & EKG at follow-up appointment. I will review the patient's already scheduled serum blood work at that time. 2. Health maintenance: Patient plans on obtaining influenza vaccine with his family this fall. 3. Follow-up: Patient to return to clinic April 2017 or sooner if needed.

## 2015-04-01 NOTE — Patient Instructions (Signed)
1. Call my office if you have any new cough or difficulty breathing. 2. We will repeat breathing tests at her next appointment. 3. We will do an EKG of your heart at her next appointment. 4. I will see you back in April 2017 or sooner if needed.

## 2015-09-16 ENCOUNTER — Other Ambulatory Visit: Payer: Self-pay | Admitting: Internal Medicine

## 2015-09-16 NOTE — Telephone Encounter (Signed)
Sent to the pharmacy by e-scribe.  Pt has upcoming CPX on 09/26/15

## 2015-09-19 ENCOUNTER — Other Ambulatory Visit (INDEPENDENT_AMBULATORY_CARE_PROVIDER_SITE_OTHER): Payer: BC Managed Care – PPO

## 2015-09-19 DIAGNOSIS — Z0000 Encounter for general adult medical examination without abnormal findings: Secondary | ICD-10-CM | POA: Diagnosis not present

## 2015-09-19 LAB — LIPID PANEL
CHOLESTEROL: 164 mg/dL (ref 0–200)
HDL: 55.6 mg/dL (ref >39.00)
LDL Cholesterol: 97 mg/dL (ref 0–99)
NonHDL: 107.98
Total CHOL/HDL Ratio: 3
Triglycerides: 57 mg/dL (ref 0.0–149.0)
VLDL: 11.4 mg/dL (ref 0.0–40.0)

## 2015-09-19 LAB — PSA: PSA: 0.61 ng/mL (ref 0.10–4.00)

## 2015-09-19 LAB — CBC WITH DIFFERENTIAL/PLATELET
BASOS ABS: 0 10*3/uL (ref 0.0–0.1)
BASOS PCT: 0.2 % (ref 0.0–3.0)
EOS ABS: 0.2 10*3/uL (ref 0.0–0.7)
EOS PCT: 4.9 % (ref 0.0–5.0)
HEMATOCRIT: 43.8 % (ref 39.0–52.0)
Hemoglobin: 14.9 g/dL (ref 13.0–17.0)
LYMPHS PCT: 28.5 % (ref 12.0–46.0)
Lymphs Abs: 1.1 10*3/uL (ref 0.7–4.0)
MCHC: 34.1 g/dL (ref 30.0–36.0)
MCV: 82 fl (ref 78.0–100.0)
MONOS PCT: 17.9 % — AB (ref 3.0–12.0)
Monocytes Absolute: 0.7 10*3/uL (ref 0.1–1.0)
Neutro Abs: 1.9 10*3/uL (ref 1.4–7.7)
Neutrophils Relative %: 48.5 % (ref 43.0–77.0)
PLATELETS: 213 10*3/uL (ref 150.0–400.0)
RBC: 5.35 Mil/uL (ref 4.22–5.81)
RDW: 13.8 % (ref 11.5–15.5)
WBC: 3.9 10*3/uL — ABNORMAL LOW (ref 4.0–10.5)

## 2015-09-19 LAB — HEPATIC FUNCTION PANEL
ALBUMIN: 4.7 g/dL (ref 3.5–5.2)
ALK PHOS: 76 U/L (ref 39–117)
ALT: 22 U/L (ref 0–53)
AST: 25 U/L (ref 0–37)
BILIRUBIN DIRECT: 0.3 mg/dL (ref 0.0–0.3)
TOTAL PROTEIN: 7.7 g/dL (ref 6.0–8.3)
Total Bilirubin: 1.3 mg/dL — ABNORMAL HIGH (ref 0.2–1.2)

## 2015-09-19 LAB — BASIC METABOLIC PANEL
BUN: 20 mg/dL (ref 6–23)
CHLORIDE: 98 meq/L (ref 96–112)
CO2: 30 meq/L (ref 19–32)
Calcium: 9.8 mg/dL (ref 8.4–10.5)
Creatinine, Ser: 1.23 mg/dL (ref 0.40–1.50)
GFR: 78.42 mL/min (ref >60.00)
Glucose, Bld: 115 mg/dL — ABNORMAL HIGH (ref 70–99)
POTASSIUM: 3.3 meq/L — AB (ref 3.5–5.1)
SODIUM: 138 meq/L (ref 135–145)

## 2015-09-19 LAB — TSH: TSH: 0.56 u[IU]/mL (ref 0.35–4.50)

## 2015-09-25 NOTE — Patient Instructions (Addendum)
Potassium is somewhat low probably partly from the diuretic  In the BP med  Decrease sodium and salts increase  potassium in foods and   Check lab in 3-4 weeks  If not better we should add a pill of potassium .  Also blood sugar is up   Stop the sweet tea and limit simmple carbohydrates processed foods   Chips baked goods sweet drinks etc.  Will be contacted about appt with dr Katrinka Blazing [facility] medicine  About the back leg sx.      Health Maintenance, Male A healthy lifestyle and preventative care can promote health and wellness.  Maintain regular health, dental, and eye exams.  Eat a healthy diet. Foods like vegetables, fruits, whole grains, low-fat dairy products, and lean protein foods contain the nutrients you need and are low in calories. Decrease your intake of foods high in solid fats, added sugars, and salt. Get information about a proper diet from your health care provider, if necessary.  Regular physical exercise is one of the most important things you can do for your health. Most adults should get at least 150 minutes of moderate-intensity exercise (any activity that increases your heart rate and causes you to sweat) each week. In addition, most adults need muscle-strengthening exercises on 2 or more days a week.   Maintain a healthy weight. The body mass index (BMI) is a screening tool to identify possible weight problems. It provides an estimate of body fat based on height and weight. Your health care provider can find your BMI and can help you achieve or maintain a healthy weight. For males 20 years and older:  A BMI below 18.5 is considered underweight.  A BMI of 18.5 to 24.9 is normal.  A BMI of 25 to 29.9 is considered overweight.  A BMI of 30 and above is considered obese.  Maintain normal blood lipids and cholesterol by exercising and minimizing your intake of saturated fat. Eat a balanced diet with plenty of fruits and vegetables. Blood tests for lipids and cholesterol  should begin at age 50 and be repeated every 5 years. If your lipid or cholesterol levels are high, you are over age 19, or you are at high risk for heart disease, you may need your cholesterol levels checked more frequently.Ongoing high lipid and cholesterol levels should be treated with medicines if diet and exercise are not working.  If you smoke, find out from your health care provider how to quit. If you do not use tobacco, do not start.  Lung cancer screening is recommended for adults aged 55-80 years who are at high risk for developing lung cancer because of a history of smoking. A yearly low-dose CT scan of the lungs is recommended for people who have at least a 30-pack-year history of smoking and are current smokers or have quit within the past 15 years. A pack year of smoking is smoking an average of 1 pack of cigarettes a day for 1 year (for example, a 30-pack-year history of smoking could mean smoking 1 pack a day for 30 years or 2 packs a day for 15 years). Yearly screening should continue until the smoker has stopped smoking for at least 15 years. Yearly screening should be stopped for people who develop a health problem that would prevent them from having lung cancer treatment.  If you choose to drink alcohol, do not have more than 2 drinks per day. One drink is considered to be 12 oz (360 mL) of beer, 5  oz (150 mL) of wine, or 1.5 oz (45 mL) of liquor.  Avoid the use of street drugs. Do not share needles with anyone. Ask for help if you need support or instructions about stopping the use of drugs.  High blood pressure causes heart disease and increases the risk of stroke. High blood pressure is more likely to develop in:  People who have blood pressure in the end of the normal range (100-139/85-89 mm Hg).  People who are overweight or obese.  People who are African American.  If you are 5718-339 years of age, have your blood pressure checked every 3-5 years. If you are 56 years of age  or older, have your blood pressure checked every year. You should have your blood pressure measured twice--once when you are at a hospital or clinic, and once when you are not at a hospital or clinic. Record the average of the two measurements. To check your blood pressure when you are not at a hospital or clinic, you can use:  An automated blood pressure machine at a pharmacy.  A home blood pressure monitor.  If you are 2845-56 years old, ask your health care provider if you should take aspirin to prevent heart disease.  Diabetes screening involves taking a blood sample to check your fasting blood sugar level. This should be done once every 3 years after age 56 if you are at a normal weight and without risk factors for diabetes. Testing should be considered at a younger age or be carried out more frequently if you are overweight and have at least 1 risk factor for diabetes.  Colorectal cancer can be detected and often prevented. Most routine colorectal cancer screening begins at the age of 56 and continues through age 56. However, your health care provider may recommend screening at an earlier age if you have risk factors for colon cancer. On a yearly basis, your health care provider may provide home test kits to check for hidden blood in the stool. A small camera at the end of a tube may be used to directly examine the colon (sigmoidoscopy or colonoscopy) to detect the earliest forms of colorectal cancer. Talk to your health care provider about this at age 56 when routine screening begins. A direct exam of the colon should be repeated every 5-10 years through age 56, unless early forms of precancerous polyps or small growths are found.  People who are at an increased risk for hepatitis B should be screened for this virus. You are considered at high risk for hepatitis B if:  You were born in a country where hepatitis B occurs often. Talk with your health care provider about which countries are  considered high risk.  Your parents were born in a high-risk country and you have not received a shot to protect against hepatitis B (hepatitis B vaccine).  You have HIV or AIDS.  You use needles to inject street drugs.  You live with, or have sex with, someone who has hepatitis B.  You are a man who has sex with other men (MSM).  You get hemodialysis treatment.  You take certain medicines for conditions like cancer, organ transplantation, and autoimmune conditions.  Hepatitis C blood testing is recommended for all people born from [id number] through 1966 and any individual with known risk factors for hepatitis C.  Healthy men should no longer receive prostate-specific antigen (PSA) blood tests as part of routine cancer screening. Talk to your health care provider about prostate cancer screening.  Testicular cancer screening is not recommended for adolescents or adult males who have no symptoms. Screening includes self-exam, a health care provider exam, and other screening tests. Consult with your health care provider about any symptoms you have or any concerns you have about testicular cancer.  Practice safe sex. Use condoms and avoid high-risk sexual practices to reduce the spread of sexually transmitted infections (STIs).  You should be screened for STIs, including gonorrhea and chlamydia if:  You are sexually active and are younger than 24 years.  You are older than 24 years, and your health care provider tells you that you are at risk for this type of infection.  Your sexual activity has changed since you were last screened, and you are at an increased risk for chlamydia or gonorrhea. Ask your health care provider if you are at risk.  If you are at risk of being infected with HIV, it is recommended that you take a prescription medicine daily to prevent HIV infection. This is called pre-exposure prophylaxis (PrEP). You are considered at risk if:  You are a man who has sex with other  men (MSM).  You are a heterosexual man who is sexually active with multiple partners.  You take drugs by injection.  You are sexually active with a partner who has HIV.  Talk with your health care provider about whether you are at high risk of being infected with HIV. If you choose to begin PrEP, you should first be tested for HIV. You should then be tested every 3 months for as long as you are taking PrEP.  Use sunscreen. Apply sunscreen liberally and repeatedly throughout the day. You should seek shade when your shadow is shorter than you. Protect yourself by wearing long sleeves, pants, a wide-brimmed hat, and sunglasses year round whenever you are outdoors.  Tell your health care provider of new moles or changes in moles, especially if there is a change in shape or color. Also, tell your health care provider if a mole is larger than the size of a pencil eraser.  A one-time screening for abdominal aortic aneurysm (AAA) and surgical repair of large AAAs by ultrasound is recommended for men aged 69-75 years who are current or former smokers.  Stay current with your vaccines (immunizations).   This information is not intended to replace advice given to you by your health care provider. Make sure you discuss any questions you have with your health care provider.   Document Released: 11/24/2007 Document Revised: 06/18/2014 Document Reviewed: 10/23/2010 Elsevier Interactive Patient Education [employer].

## 2015-09-25 NOTE — Progress Notes (Signed)
Pre visit review using our clinic review tool, if applicable. No additional management support is needed unless otherwise documented below in the visit note.  Chief Complaint  Patient presents with  . Annual Exam    meds  . Hypertension    HPI: Patient  Tyler Barrera  55 y.o. comes in today for Preventive Health Care visit  And med management HT: Taking medication no noted side effects blood pressure seems to be in range Sarcoid" has a new pulmonary.evaluating to do a 6 minute walk soon. He weight lung function. Still has intermittent discomfort right back right lateral leg has he has a physical job sometimes bothers him at night but not really waking him up no weakness noted. Health Maintenance  Topic Date Due  . Hepatitis C Screening  November 20, 1960  . HIV Screening  03/18/1975  . INFLUENZA VACCINE  01/10/2016  . TETANUS/TDAP  06/11/2016  . COLONOSCOPY  08/28/2020   Health Maintenance Review LIFESTYLE:  Exercise:  Physical job Tobacco/ETS: No Alcohol: per day 90 Sugar beverages: Sweet tea more recently a small amount OJ with his medicines  Sleep: 6 busy at work  Drug use: no    ROS:  See above  GEN/ HEENT: No fever, significant weight changes sweats headaches vision problems hearing changes, CV/ PULM; No chest pain shortness of breath cough, syncope,edema  change in exercise tolerance. GI /GU: No adominal pain, vomiting, change in bowel habits. No blood in the stool. No significant GU symptoms. SKIN/HEME: ,no acute skin rashes suspicious lesions or bleeding. No lymphadenopathy, nodules, masses.  NEURO/ PSYCH:  No neurologic signs such as weakness numbness. No depression anxiety. IMM/ Allergy: No unusual infections.  Allergy .   REST of 12 system review negative except as per HPI   Past Medical History  Diagnosis Date  . Hyperlipidemia   . Hypertension   . Pulmonary sarcoidosis (HCC)     Dr Shelle Iron     Past Surgical History  Procedure Laterality Date  . Bronchoscopy       Family History  Problem Relation Age of Onset  . Hypertension Mother   . Hyperlipidemia Mother   . Hyperlipidemia Father   . Hypertension Father   . Prostate cancer Father   . Kidney failure Father   . Sarcoidosis Neg Hx   . Rheumatologic disease Neg Hx   . Lung disease Neg Hx     Social History   Social History  . Marital Status: Married    Spouse Name: N/A  . Number of Children: N/A  . Years of Education: N/A   Social History Main Topics  . Smoking status: Never Smoker   . Smokeless tobacco: None  . Alcohol Use: 0.0 oz/week    0 Standard drinks or equivalent per week     Comment: Occasional glass of wine.  . Drug Use: No  . Sexual Activity: Not Asked   Other Topics Concern  . None   Social History Narrative   Household of three married   Non-smoker regular exercise   [facility] plus   Self-employed carpet cleaning.   No pets    Son at [employer] graduated Another teen at home.   Soc etoh  caffiene tea some soda minimal   Exercises       [facility]:   Originally from [place]. Always lived in [place]. Prior travel to [place], [place], [place], [place], [place], & [place]. He feels he has been to pretty much the entire continental 48  state. No international travel. Currently works cleaning carpets. No pets currently. No bid exposure. No mold or hot tub exposure.     Outpatient Prescriptions Prior to Visit  Medication Sig Dispense Refill  . simvastatin (ZOCOR) 40 MG tablet TAKE 1 TABLET BY MOUTH AT BEDTIME 30 tablet 0  . triamterene-hydrochlorothiazide (MAXZIDE-25) 37.5-25 MG tablet TAKE 1 TABLET BY MOUTH EVERY DAY 30 tablet 0   No facility-administered medications prior to visit.     EXAM:  BP 136/84 mmHg  Temp(Src) 97.6 F (36.4 C) (Oral)  Ht 5' 5.5" (1.664 m)  Wt 156 lb 9.6 oz (71.033 kg)  BMI 25.65 kg/m2  Body mass index is 25.65 kg/(m^2).  Physical Exam: Vital signs reviewed ZOX:WRUE is a well-developed well-nourished alert cooperative gentleman     who appearsr stated age in no acute distress. Looks well  HEENT: normocephalic atraumatic , Eyes: PERRL EOM's full, conjunctiva clear, Nares: paten,t no deformity discharge or tenderness., Ears: no deformity EAC's clear TMs with normal landmarks. Mouth: clear OP, no lesions, edema.  Moist mucous membranes. Dentition in adequate repair. NECK: supple without masses, thyromegaly or bruits. CHEST/PULM:  Clear to auscultation and percussion breath sounds equal no wheeze , rales or rhonchi. No chest wall deformities or tenderness. CV: PMI is nondisplaced, S1 S2 no gallops, murmurs, rubs. Peripheral pulses are full without delay.No JVD .  ABDOMEN: Bowel sounds normal nontender  No guard or rebound, no hepato splenomegal no CVA tenderness.  Extremtities:  No clubbing cyanosis or edema, no acute joint swelling or redness no focal atrophy NEURO:  Oriented x3, cranial nerves 3-12 appear to be intact, no obvious focal weakness,gait within normal limits no abnormal reflexes or asymmetrical SKIN: No acute rashes normal turgor, color, no bruising or petechiae. PSYCH: Oriented, good eye contact, no obvious depression anxiety, cognition and judgment appear normal. LN: no cervical axillary inguinal adenopathy  Lab Results  Component Value Date   WBC 3.9* 09/19/2015   HGB 14.9 09/19/2015   HCT 43.8 09/19/2015   PLT 213.0 09/19/2015   GLUCOSE 115* 09/19/2015   CHOL 164 09/19/2015   TRIG 57.0 09/19/2015   HDL 55.60 09/19/2015   LDLDIRECT 203.0 04/22/2007   LDLCALC 97 09/19/2015   ALT 22 09/19/2015   AST 25 09/19/2015   NA 138 09/19/2015   K 3.3* 09/19/2015   CL 98 09/19/2015   CREATININE 1.23 09/19/2015   BUN 20 09/19/2015   CO2 30 09/19/2015   TSH 0.56 09/19/2015   PSA 0.61 09/19/2015   BP Readings from Last 3 Encounters:  09/26/15 136/84  04/01/15 132/84  09/20/14 136/78   Wt Readings from Last 3 Encounters:  09/26/15 156 lb 9.6 oz (71.033 kg)  04/01/15 164 lb (74.39 kg)  09/20/14 165 lb  (74.844 kg)     ASSESSMENT AND PLAN:  Discussed the following assessment and plan:  Visit for preventive health examination  Essential hypertension - seems controlled   Medication management  Hypokalemia - prob from diuretic pt would like to change diet first  Hyperlipidemia - no se of med reported  could add to bg elevation issues  Fasting hyperglycemia - avouit sugar drinks attend to lsi and plan fu lab an a1c   PULMONARY SARCOIDOSIS  Right leg pain - poss radiation from back problematic and long standing though wax and wane intferes  ge op SM ? back vs IT band - Plan: Ambulatory referral to Sports Medicine  Patient Care Team: Madelin Headings, MD as PCP - [facility]  Christella Hartigan, MD (Gastroenterology) Roslynn Amble, MD as Consulting Physician (Pulmonary Disease) Patient Instructions   Potassium is somewhat low probably partly from the diuretic  In the BP med  Decrease sodium and salts increase  potassium in foods and   Check lab in 3-4 weeks  If not better we should add a pill of potassium .  Also blood sugar is up   Stop the sweet tea and limit simmple carbohydrates processed foods   Chips baked goods sweet drinks etc.  Will be contacted about appt with dr Katrinka Blazing [facility]  About the back leg sx.      Health Maintenance, Male A healthy lifestyle and preventative care can promote health and wellness.  Maintain regular health, dental, and eye exams.  Eat a healthy diet. Foods like vegetables, fruits, whole grains, low-fat dairy products, and lean protein foods contain the nutrients you need and are low in calories. Decrease your intake of foods high in solid fats, added sugars, and salt. Get information about a proper diet from your health care provider, if necessary.  Regular physical exercise is one of the most important things you can do for your health. Most adults should get at least 150 minutes of moderate-intensity exercise (any activity that increases your  heart rate and causes you to sweat) each week. In addition, most adults need muscle-strengthening exercises on 2 or more days a week.   Maintain a healthy weight. The body mass index (BMI) is a screening tool to identify possible weight problems. It provides an estimate of body fat based on height and weight. Your health care provider can find your BMI and can help you achieve or maintain a healthy weight. For males 20 years and older:  A BMI below 18.5 is considered underweight.  A BMI of 18.5 to 24.9 is normal.  A BMI of 25 to 29.9 is considered overweight.  A BMI of 30 and above is considered obese.  Maintain normal blood lipids and cholesterol by exercising and minimizing your intake of saturated fat. Eat a balanced diet with plenty of fruits and vegetables. Blood tests for lipids and cholesterol should begin at age 61 and be repeated every 5 years. If your lipid or cholesterol levels are high, you are over age 53, or you are at high risk for heart disease, you may need your cholesterol levels checked more frequently.Ongoing high lipid and cholesterol levels should be treated with medicines if diet and exercise are not working.  If you smoke, find out from your health care provider how to quit. If you do not use tobacco, do not start.  Lung cancer screening is recommended for adults aged 55-80 years who are at high risk for developing lung cancer because of a history of smoking. A yearly low-dose CT scan of the lungs is recommended for people who have at least a 30-pack-year history of smoking and are current smokers or have quit within the past 15 years. A pack year of smoking is smoking an average of 1 pack of cigarettes a day for 1 year (for example, a 30-pack-year history of smoking could mean smoking 1 pack a day for 30 years or 2 packs a day for 15 years). Yearly screening should continue until the smoker has stopped smoking for at least 15 years. Yearly screening should be stopped for  people who develop a health problem that would prevent them from having lung cancer treatment.  If you choose to drink alcohol, do not have more than  2 drinks per day. One drink is considered to be 12 oz (360 mL) of beer, 5 oz (150 mL) of wine, or 1.5 oz (45 mL) of liquor.  Avoid the use of street drugs. Do not share needles with anyone. Ask for help if you need support or instructions about stopping the use of drugs.  High blood pressure causes heart disease and increases the risk of stroke. High blood pressure is more likely to develop in:  People who have blood pressure in the end of the normal range (100-139/85-89 mm Hg).  People who are overweight or obese.  People who are African American.  If you are 2518-56 years of age, have your blood pressure checked every 3-5 years. If you are 56 years of age or older, have your blood pressure checked every year. You should have your blood pressure measured twice--once when you are at a hospital or clinic, and once when you are not at a hospital or clinic. Record the average of the two measurements. To check your blood pressure when you are not at a hospital or clinic, you can use:  An automated blood pressure machine at a pharmacy.  A home blood pressure monitor.  If you are 3545-10277 years old, ask your health care provider if you should take aspirin to prevent heart disease.  Diabetes screening involves taking a blood sample to check your fasting blood sugar level. This should be done once every 3 years after age 56 if you are at a normal weight and without risk factors for diabetes. Testing should be considered at a younger age or be carried out more frequently if you are overweight and have at least 1 risk factor for diabetes.  Colorectal cancer can be detected and often prevented. Most routine colorectal cancer screening begins at the age of 56 and continues through age 56. However, your health care provider may recommend screening at an earlier  age if you have risk factors for colon cancer. On a yearly basis, your health care provider may provide home test kits to check for hidden blood in the stool. A small camera at the end of a tube may be used to directly examine the colon (sigmoidoscopy or colonoscopy) to detect the earliest forms of colorectal cancer. Talk to your health care provider about this at age 56 when routine screening begins. A direct exam of the colon should be repeated every 5-10 years through age 56, unless early forms of precancerous polyps or small growths are found.  People who are at an increased risk for hepatitis B should be screened for this virus. You are considered at high risk for hepatitis B if:  You were born in a country where hepatitis B occurs often. Talk with your health care provider about which countries are considered high risk.  Your parents were born in a high-risk country and you have not received a shot to protect against hepatitis B (hepatitis B vaccine).  You have HIV or AIDS.  You use needles to inject street drugs.  You live with, or have sex with, someone who has hepatitis B.  You are a man who has sex with other men (MSM).  You get hemodialysis treatment.  You take certain medicines for conditions like cancer, organ transplantation, and autoimmune conditions.  Hepatitis C blood testing is recommended for all people born from [id number] through 1965 and any individual with known risk factors for hepatitis C.  Healthy men should no longer receive prostate-specific antigen (PSA) blood tests  as part of routine cancer screening. Talk to your health care provider about prostate cancer screening.  Testicular cancer screening is not recommended for adolescents or adult males who have no symptoms. Screening includes self-exam, a health care provider exam, and other screening tests. Consult with your health care provider about any symptoms you have or any concerns you have about testicular  cancer.  Practice safe sex. Use condoms and avoid high-risk sexual practices to reduce the spread of sexually transmitted infections (STIs).  You should be screened for STIs, including gonorrhea and chlamydia if:  You are sexually active and are younger than 24 years.  You are older than 24 years, and your health care provider tells you that you are at risk for this type of infection.  Your sexual activity has changed since you were last screened, and you are at an increased risk for chlamydia or gonorrhea. Ask your health care provider if you are at risk.  If you are at risk of being infected with HIV, it is recommended that you take a prescription medicine daily to prevent HIV infection. This is called pre-exposure prophylaxis (PrEP). You are considered at risk if:  You are a man who has sex with other men (MSM).  You are a heterosexual man who is sexually active with multiple partners.  You take drugs by injection.  You are sexually active with a partner who has HIV.  Talk with your health care provider about whether you are at high risk of being infected with HIV. If you choose to begin PrEP, you should first be tested for HIV. You should then be tested every 3 months for as long as you are taking PrEP.  Use sunscreen. Apply sunscreen liberally and repeatedly throughout the day. You should seek shade when your shadow is shorter than you. Protect yourself by wearing long sleeves, pants, a wide-brimmed hat, and sunglasses year round whenever you are outdoors.  Tell your health care provider of new moles or changes in moles, especially if there is a change in shape or color. Also, tell your health care provider if a mole is larger than the size of a pencil eraser.  A one-time screening for abdominal aortic aneurysm (AAA) and surgical repair of large AAAs by ultrasound is recommended for men aged 65-75 years who are current or former smokers.  Stay current with your vaccines  (immunizations).   This information is not intended to replace advice given to you by your health care provider. Make sure you discuss any questions you have with your health care provider.   Document Released: 11/24/2007 Document Revised: 06/18/2014 Document Reviewed: 10/23/2010 Elsevier Interactive Patient Education [employer]     [place]ora. Nalanie Winiecki M.D.

## 2015-09-26 ENCOUNTER — Ambulatory Visit (INDEPENDENT_AMBULATORY_CARE_PROVIDER_SITE_OTHER): Payer: BC Managed Care – PPO | Admitting: Internal Medicine

## 2015-09-26 ENCOUNTER — Encounter: Payer: Self-pay | Admitting: Internal Medicine

## 2015-09-26 VITALS — BP 136/84 | Temp 97.6°F | Ht 65.5 in | Wt 156.6 lb

## 2015-09-26 DIAGNOSIS — Z0000 Encounter for general adult medical examination without abnormal findings: Secondary | ICD-10-CM

## 2015-09-26 DIAGNOSIS — E876 Hypokalemia: Secondary | ICD-10-CM

## 2015-09-26 DIAGNOSIS — M79604 Pain in right leg: Secondary | ICD-10-CM

## 2015-09-26 DIAGNOSIS — I10 Essential (primary) hypertension: Secondary | ICD-10-CM | POA: Diagnosis not present

## 2015-09-26 DIAGNOSIS — Z79899 Other long term (current) drug therapy: Secondary | ICD-10-CM

## 2015-09-26 DIAGNOSIS — E785 Hyperlipidemia, unspecified: Secondary | ICD-10-CM

## 2015-09-26 DIAGNOSIS — R7301 Impaired fasting glucose: Secondary | ICD-10-CM

## 2015-09-26 DIAGNOSIS — D869 Sarcoidosis, unspecified: Secondary | ICD-10-CM

## 2015-10-04 ENCOUNTER — Ambulatory Visit: Payer: BC Managed Care – PPO | Admitting: Pulmonary Disease

## 2015-10-04 ENCOUNTER — Ambulatory Visit: Payer: BC Managed Care – PPO

## 2015-10-06 ENCOUNTER — Ambulatory Visit (INDEPENDENT_AMBULATORY_CARE_PROVIDER_SITE_OTHER): Payer: BC Managed Care – PPO | Admitting: Pulmonary Disease

## 2015-10-06 ENCOUNTER — Encounter: Payer: Self-pay | Admitting: Pulmonary Disease

## 2015-10-06 VITALS — BP 134/74 | HR 74 | Ht 66.0 in | Wt 161.2 lb

## 2015-10-06 DIAGNOSIS — D869 Sarcoidosis, unspecified: Secondary | ICD-10-CM

## 2015-10-06 DIAGNOSIS — D862 Sarcoidosis of lung with sarcoidosis of lymph nodes: Secondary | ICD-10-CM

## 2015-10-06 NOTE — Patient Instructions (Signed)
   If you have any new breathing problems before your next appointment.  Remember to schedule an appointment with an ophthalmologist to check urine output for high involvement of your sarcoidosis.  We will do a breathing test at your next appointment in 6 months.  I will be happy to see sooner if you have any new problems with your breathing before your next appointment.  TESTS ORDERED: 1. Full Pulmonary function testing at follow-up appointment 2. EKG today

## 2015-10-06 NOTE — Progress Notes (Signed)
Subjective:    Patient ID: Tyler Barrera, male    DOB: 07/16/1960, 55 y.o.   MRN: [id number]  C.C.:  Follow-up for Stage II Sarcoidosis.  HPI Stage II Sarcoidosis:  No prior treatment. Diagnosis 2007 with transbronchial biopsies. Denies any coughing. No dyspnea or wheezing. No eye swelling, blurring vision, or scleral erythema. He reports his last opthlamology exam was over 1 year ago. No chest pain or pressure. No chest pain, pressure or palpitations. No syncope or near syncope.   Review of Systems No rashes or bruising. No focal weakness, numbness, or tingling.   Allergies  Allergen Reactions  . Prednisone     Nausea, "Out of it" . Note: He was also on Vicodin simultaneously    Current Outpatient Prescriptions on File Prior to Visit  Medication Sig Dispense Refill  . simvastatin (ZOCOR) 40 MG tablet TAKE 1 TABLET BY MOUTH AT BEDTIME 30 tablet 0  . triamterene-hydrochlorothiazide (MAXZIDE-25) 37.5-25 MG tablet TAKE 1 TABLET BY MOUTH EVERY DAY 30 tablet 0   No current facility-administered medications on file prior to visit.    Past Medical History  Diagnosis Date  . Hyperlipidemia   . Hypertension   . Pulmonary sarcoidosis (HCC)     Dr Shelle Ironlance     Past Surgical History  Procedure Laterality Date  . Bronchoscopy      Family History  Problem Relation Age of Onset  . Hypertension Mother   . Hyperlipidemia Mother   . Hyperlipidemia Father   . Hypertension Father   . Prostate cancer Father   . Kidney failure Father   . Sarcoidosis Neg Hx   . Rheumatologic disease Neg Hx   . Lung disease Neg Hx     Social History   Social History  . Marital Status: Married    Spouse Name: N/A  . Number of Children: N/A  . Years of Education: N/A   Social History Main Topics  . Smoking status: Never Smoker   . Smokeless tobacco: None  . Alcohol Use: 0.0 oz/week    0 Standard drinks or equivalent per week     Comment: Occasional glass of wine.  . Drug Use: No  . Sexual  Activity: Not Asked   Other Topics Concern  . None   Social History Narrative   Household of three married   Non-smoker regular exercise   [facility]   Self-employed carpet cleaning.   No pets    Son at [employer] [place] graduated Another teen at home.   Soc etoh  caffiene tea some soda minimal   Exercises        Pulmonary:   Originally from [place]. Always lived in [place]. Prior travel to [place], [place], [place], [place], [place], & [place]. He feels he has been to pretty much the entire continental 48 state. No international travel. Currently works cleaning carpets. No pets currently. No bid exposure. No mold or hot tub exposure.       Objective:   Physical Exam BP 134/74 mmHg  Pulse 74  Ht 5\' 6"  (1.676 m)  Wt 161 lb 3.2 oz (73.12 kg)  BMI 26.03 kg/m2  SpO2 94% General:  Physically fit male. No acute distress. Awake. Integument:  Warm & dry. No rash on exposed skin.  HEENT:  Moist mucus membranes. No oral ulcers. No scleral injection Cardiovascular:  Regular rate & rhythm. No edema.   Pulmonary:  Clear bilaterally to auscultation. Normal work of breathing on room air. Speaking in  complete sentences Abdomen: Soft. Normal bowel sounds. Nondistended.  Musculoskeletal:  Normal bulk and tone. No joint deformity or effusion appreciated.  PFT 02/16/14: FVC 2.81 L (79%) FEV1 2.57 L (91%) FEV1/FVC 0.91 FEF 25-75 4.03 L (143%) no bronchodilator response TLC 4.18 L (67%) RV 58%DLCO uncorrected 80% 12/19/10: FVC 2.94 L (66%) FEV1 2.47 L (75%) FEV1/FVC 0.84 FEF 25-75 3.34 L (98%) no bronchodilator response TLC 4.34 L (71%) RV 68% ERV 56% DLCO uncorrected 84%  IMAGING CXR PA/LAT 02/16/14 (personally reviewed by me with the patient today): Prominent bilateral interstitial markings. No pleural effusion or thickening. Low lung volumes. Heart normal in size. Mediastinum otherwise normal in contour.  CT CHEST W/O 3/30/7 (personally reviewed by me with the patient today): No pleural  effusion or thickening. No pericardial effusion. Bulky mediastinal & hilar lymphadenopathy. Bilateral parenchymal nodules right greater than left with an atypical predominance. Some areas of confluence and opacification within right upper lobe as well.  CARDIAC EKG 10/06/15 (reviewed by me): Normal sinus rhythm. No evidence of ischemia or conduction delay.  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 No necrosis. Stains for fungus & AFB negative.  BRONCHIAL WASH (4/11/7): Negative for malignancy.  LABS 09/19/15 CBC: 3.9/14.9/43.8/213 BMP: 138/3.3/98/30/20/1.23/115/9.8 LFT: 4.7/7.7/1.3/76/25/22  09/13/14 CBC: 4.7/15.4/46.6/229 BMP: 142/4.2/103/29/18/1.13/96/9.9 LFT: 4.8/8.1/0.7/88/25/29    Assessment & Plan:  55 year old male with underlying Stage II sarcoidosis. I again reviewed the patient's chest CT and chest x-ray imaging with the patient today. He continues to remain asymptomatic with regards to his sarcoidosis. I reviewed his prior blood work which does show a mild elevation in his total bilirubin but no other obvious signs of active sarcoidosis. I did caution the patient against potential eye, neurological, cardiac, and bone marrow involvement of sarcoidosis. I instructed the patient contact my office if he had any new breathing problems or symptoms before his next appointment.  1. Sarcoidosis: Patient recommended to continue yearly evaluations by ophthalmology. Checking full pulmonary function testing at follow-up appointment and chest imaging versus further testing based on symptoms and/or findings. Continuing yearly lab work & EKG. 2. Health maintenance: Influenza vaccine October 2016. He believes he may previously have had the Pneumovax. 3. Follow-up: Patient to return to clinic in 6 months or sooner if needed.  Donna Christen Jamison Neighbor, M.D. [facility] Pager:   [phone] After 3pm or if no response, call [phone] 9:22 AM 10/06/2015

## 2015-10-11 ENCOUNTER — Ambulatory Visit: Payer: BC Managed Care – PPO | Admitting: Family Medicine

## 2015-10-11 DIAGNOSIS — Z0289 Encounter for other administrative examinations: Secondary | ICD-10-CM

## 2015-10-16 ENCOUNTER — Other Ambulatory Visit: Payer: Self-pay | Admitting: Internal Medicine

## 2015-10-17 NOTE — Telephone Encounter (Signed)
Sent to the pharmacy by e-scribe. 

## 2015-10-24 ENCOUNTER — Other Ambulatory Visit (INDEPENDENT_AMBULATORY_CARE_PROVIDER_SITE_OTHER): Payer: BC Managed Care – PPO

## 2015-10-24 ENCOUNTER — Other Ambulatory Visit: Payer: BC Managed Care – PPO | Admitting: Internal Medicine

## 2015-10-24 DIAGNOSIS — E119 Type 2 diabetes mellitus without complications: Secondary | ICD-10-CM

## 2015-10-24 DIAGNOSIS — I10 Essential (primary) hypertension: Secondary | ICD-10-CM | POA: Diagnosis not present

## 2015-10-24 DIAGNOSIS — E612 Magnesium deficiency: Secondary | ICD-10-CM

## 2015-10-24 LAB — MAGNESIUM: Magnesium: 2.2 mg/dL (ref 1.5–2.5)

## 2015-10-24 LAB — BASIC METABOLIC PANEL
BUN: 20 mg/dL (ref 6–23)
CHLORIDE: 100 meq/L (ref 96–112)
CO2: 32 mEq/L (ref 19–32)
Calcium: 9.6 mg/dL (ref 8.4–10.5)
Creatinine, Ser: 1.26 mg/dL (ref 0.40–1.50)
GFR: 76.25 mL/min (ref >60.00)
GLUCOSE: 102 mg/dL — AB (ref 70–99)
POTASSIUM: 3.2 meq/L — AB (ref 3.5–5.1)
SODIUM: 138 meq/L (ref 135–145)

## 2015-10-24 LAB — HEMOGLOBIN A1C: Hgb A1c MFr Bld: 5.9 % (ref 4.6–6.5)

## 2015-11-11 ENCOUNTER — Other Ambulatory Visit: Payer: Self-pay

## 2015-11-11 ENCOUNTER — Telehealth: Payer: Self-pay | Admitting: Internal Medicine

## 2015-11-11 MED ORDER — POTASSIUM CHLORIDE CRYS ER 20 MEQ PO TBCR: 20.0000 meq | EXTENDED_RELEASE_TABLET | Freq: Every day | ORAL | Status: DC

## 2015-11-11 NOTE — Telephone Encounter (Signed)
Okay for pt to take both medications?

## 2015-11-11 NOTE — Telephone Encounter (Signed)
Called pharmacy with Dr. Rosezella FloridaPanosh's instructions.

## 2015-11-11 NOTE — Telephone Encounter (Signed)
Please proceed with plan   Please see last result note  Potassium is low  3.2 on maxide so adding    Potassium supplement  for now .   make sure  Recheck BMP in  About 3 weeks   To check potassium level .   thanks   Lab Results  Component Value Date   WBC 3.9* 09/19/2015   HGB 14.9 09/19/2015   HCT 43.8 09/19/2015   PLT 213.0 09/19/2015   GLUCOSE 102* 10/24/2015   CHOL 164 09/19/2015   TRIG 57.0 09/19/2015   HDL 55.60 09/19/2015   LDLDIRECT 203.0 04/22/2007   LDLCALC 97 09/19/2015   ALT 22 09/19/2015   AST 25 09/19/2015   NA 138 10/24/2015   K 3.2* 10/24/2015   CL 100 10/24/2015   CREATININE 1.26 10/24/2015   BUN 20 10/24/2015   CO2 32 10/24/2015   TSH 0.56 09/19/2015   PSA 0.61 09/19/2015   HGBA1C 5.9 10/24/2015

## 2015-11-11 NOTE — Telephone Encounter (Signed)
Pharmacy would like to verify a possible drug interaction.] They received rx for potassium chloride SA (K-DUR,KLOR-CON) 20 MEQ tablet lpt takes triamterene-hydrochlorothiazide (MAXZIDE-25) 37.5-25 MG tablet  Which can result in too much potassium in the body. Pharmacy would like to know this is being monitored and labs are being done. Please call back with confirmation.

## 2016-01-20 ENCOUNTER — Telehealth: Payer: Self-pay | Admitting: Internal Medicine

## 2016-01-20 NOTE — Telephone Encounter (Signed)
° °  Pt call to ask if he need to come in and have his potassium check    [phone]

## 2016-01-23 ENCOUNTER — Other Ambulatory Visit: Payer: Self-pay | Admitting: Family Medicine

## 2016-01-23 DIAGNOSIS — E876 Hypokalemia: Secondary | ICD-10-CM

## 2016-01-23 DIAGNOSIS — R7301 Impaired fasting glucose: Secondary | ICD-10-CM

## 2016-01-23 NOTE — Telephone Encounter (Signed)
I have placed the lab orders in Epic.  Please contact the pt and schedule him for the lab.  Thanks!!!

## 2016-02-14 ENCOUNTER — Other Ambulatory Visit: Payer: BC Managed Care – PPO

## 2016-03-07 ENCOUNTER — Other Ambulatory Visit: Payer: BC Managed Care – PPO

## 2016-03-09 ENCOUNTER — Other Ambulatory Visit: Payer: BC Managed Care – PPO

## 2016-03-12 ENCOUNTER — Other Ambulatory Visit (INDEPENDENT_AMBULATORY_CARE_PROVIDER_SITE_OTHER): Payer: BC Managed Care – PPO

## 2016-03-12 DIAGNOSIS — E876 Hypokalemia: Secondary | ICD-10-CM

## 2016-03-12 DIAGNOSIS — R7301 Impaired fasting glucose: Secondary | ICD-10-CM

## 2016-03-12 LAB — HEMOGLOBIN A1C: Hgb A1c MFr Bld: 5.9 % (ref 4.6–6.5)

## 2016-03-12 LAB — BASIC METABOLIC PANEL
BUN: 14 mg/dL (ref 6–23)
CALCIUM: 9.2 mg/dL (ref 8.4–10.5)
CO2: 30 meq/L (ref 19–32)
CREATININE: 1.19 mg/dL (ref 0.40–1.50)
Chloride: 102 mEq/L (ref 96–112)
GFR: 81.33 mL/min (ref >60.00)
GLUCOSE: 90 mg/dL (ref 70–99)
Potassium: 3.2 mEq/L — ABNORMAL LOW (ref 3.5–5.1)
SODIUM: 140 meq/L (ref 135–145)

## 2016-03-12 LAB — MAGNESIUM: Magnesium: 2 mg/dL (ref 1.5–2.5)

## 2016-03-21 ENCOUNTER — Telehealth: Payer: Self-pay | Admitting: Internal Medicine

## 2016-03-21 NOTE — Telephone Encounter (Signed)
Left a message for a return call.  See result note. 

## 2016-03-21 NOTE — Telephone Encounter (Signed)
   Apologies for lat eresponse   See result note.    Blood sugar is good( better ) kidney function  Normal also . Potassium is still 3.2  Magnesium is normal  Is he taking  Potassium pill and how much ?       The list says 20 meq per day .    How many days does he miss taking in  A week or month?   We will need to increase  Dose if    Taking every day .   Advise he sign up for my chart  To help with easier communication .

## 2016-03-21 NOTE — Telephone Encounter (Signed)
Pt needs blood work results °

## 2016-04-24 ENCOUNTER — Other Ambulatory Visit (INDEPENDENT_AMBULATORY_CARE_PROVIDER_SITE_OTHER): Payer: BC Managed Care – PPO

## 2016-04-24 DIAGNOSIS — E876 Hypokalemia: Secondary | ICD-10-CM | POA: Diagnosis not present

## 2016-04-24 LAB — POTASSIUM: POTASSIUM: 3.9 meq/L (ref 3.5–5.1)

## 2016-07-19 ENCOUNTER — Other Ambulatory Visit: Payer: Self-pay | Admitting: Family Medicine

## 2016-07-19 ENCOUNTER — Telehealth: Payer: Self-pay | Admitting: Family Medicine

## 2016-07-19 ENCOUNTER — Other Ambulatory Visit: Payer: Self-pay | Admitting: Internal Medicine

## 2016-07-19 DIAGNOSIS — Z0000 Encounter for general adult medical examination without abnormal findings: Secondary | ICD-10-CM

## 2016-07-19 DIAGNOSIS — R739 Hyperglycemia, unspecified: Secondary | ICD-10-CM

## 2016-07-19 NOTE — Telephone Encounter (Signed)
Pt has been sch

## 2016-07-19 NOTE — Telephone Encounter (Signed)
Sent to the pharmacy by e-scribe for 90 days.  Pt due for cpx in April.  Message sent to scheduling.

## 2016-07-19 NOTE — Telephone Encounter (Signed)
Pt is due for cpx and lab work in April.  I have placed the lab orders.  Please help him to make both appointments.  Thanks!!

## 2016-08-18 ENCOUNTER — Other Ambulatory Visit: Payer: Self-pay | Admitting: Internal Medicine

## 2016-09-20 ENCOUNTER — Other Ambulatory Visit: Payer: BC Managed Care – PPO

## 2016-09-22 ENCOUNTER — Other Ambulatory Visit: Payer: Self-pay | Admitting: Internal Medicine

## 2016-09-25 NOTE — Progress Notes (Signed)
Chief Complaint  Patient presents with  . Annual Exam    HPI: Patient  Tyler Barrera  56 y.o. comes in today for Preventive Health Care visit  And Chronic disease management  No major changes in health   Bp at home  135/ 80    On dyazide and  Potassium once a day .  No se noted  LIPID med no se noted Pulm no sx used to see Dr.  Shelle Iron and then Lawanda Cousins   Due for yealry soon.  Health Maintenance  Topic Date Due  . PNEUMOCOCCAL POLYSACCHARIDE VACCINE (1) 03/17/1962  . TETANUS/TDAP  06/11/2016  . HEMOGLOBIN A1C  09/10/2016  . Hepatitis C Screening  09/26/2017 (Originally Sep 06, 1960)  . HIV Screening  09/26/2017 (Originally 03/18/1975)  . INFLUENZA VACCINE  01/09/2017  . COLONOSCOPY  08/28/2020   Health Maintenance Review LIFESTYLE:  Exercise:  gyme yoga and work  Tobacco/ETS:n Alcohol: social Sugar beverages:n Sleep: 6-7 Drug use: no HH of 3  Work:  50 hours plus     ROS:  GEN/ HEENT: No fever, significant weight changes sweats headaches vision problems hearing changes, CV/ PULM; No chest pain shortness of breath cough, syncope,edema  change in exercise tolerance. GI /GU: No adominal pain, vomiting, change in bowel habits. No blood in the stool. No significant GU symptoms. SKIN/HEME: ,no acute skin rashes suspicious lesions or bleeding. No lymphadenopathy, nodules, masses.  NEURO/ PSYCH:  No neurologic signs such as weakness numbness. No depression anxiety. IMM/ Allergy: No unusual infections.  Allergy .   REST of 12 system review negative except as per HPI   Past Medical History:  Diagnosis Date  . Hyperlipidemia   . Hypertension   . Pulmonary sarcoidosis (HCC)    Dr Shelle Iron     Past Surgical History:  Procedure Laterality Date  . BRONCHOSCOPY      Family History  Problem Relation Age of Onset  . Hypertension Mother   . Hyperlipidemia Mother   . Hyperlipidemia Father   . Hypertension Father   . Prostate cancer Father   . Kidney failure Father   .  Sarcoidosis Neg Hx   . Rheumatologic disease Neg Hx   . Lung disease Neg Hx     Social History   Social History  . Marital status: Married    Spouse name: N/A  . Number of children: N/A  . Years of education: N/A   Social History Main Topics  . Smoking status: Never Smoker  . Smokeless tobacco: Never Used  . Alcohol use 0.0 oz/week     Comment: Occasional glass of wine.  . Drug use: No  . Sexual activity: Not Asked   Other Topics Concern  . None   Social History Narrative   Household of three married   Non-smoker regular exercise   [facility] 40 hours plus   Self-employed carpet cleaning.   No pets    Son at [employer] graduated Another teen at home.   Soc etoh  caffiene tea some soda minimal   Exercises       [facility]:   Originally from [place]. Always lived in [place]. Prior travel to [place], [place], [place], [place], [place], & [place]. He feels he has been to pretty much the entire continental [place]. No international travel. Currently works cleaning carpets. No pets currently. No bid exposure. No mold or hot tub exposure.     Outpatient Medications Prior to Visit  Medication Sig Dispense Refill  .  simvastatin (ZOCOR) 40 MG tablet TAKE 1 TABLET BY MOUTH AT BEDTIME 90 tablet 0  . triamterene-hydrochlorothiazide (MAXZIDE-25) 37.5-25 MG tablet TAKE 1 TABLET BY MOUTH EVERY DAY 90 tablet 0  . KLOR-CON M20 20 MEQ tablet TAKE 1 TABLET BY MOUTH EVERY DAY 30 tablet 0   No facility-administered medications prior to visit.      EXAM:  BP 130/90 (BP Location: Left Arm, Patient Position: Sitting, Cuff Size: Normal)   Pulse 73   Temp 97.6 F (36.4 C) (Oral)   Ht  (1.676 m)   Wt 161 lb 4.8 oz (73.2 kg)   BMI 26.03 kg/m   Body mass index is 26.03 kg/m. Wt Readings from Last 3 Encounters:  09/26/16 161 lb 4.8 oz (73.2 kg)  10/06/15 161 lb 3.2 oz (73.1 kg)  09/26/15 156 lb 9.6 oz (71 kg)    Physical Exam: Vital signs reviewed EAV:WUJW is a well-developed well-nourished  alert cooperative    who appearsr stated age in no acute distress.  HEENT: normocephalic atraumatic , Eyes: PERRL EOM's full, conjunctiva clear, Nares: paten,t no deformity discharge or tenderness., Ears: no deformity EAC's clear TMs with normal landmarks. Mouth: clear OP, no lesions, edema.  Moist mucous membranes. Dentition in adequate repair. NECK: supple without masses, thyromegaly or bruits. CHEST/PULM:  Clear to auscultation and percussion breath sounds equal no wheeze , rales or rhonchi. No chest wall deformities or tenderness. Breast: normal by inspection . No dimpling, discharge, masses, tenderness or discharge . CV: PMI is nondisplaced, S1 S2 no gallops, murmurs, rubs. Peripheral pulses are full without delay.No JVD .  ABDOMEN: Bowel sounds normal nontender  No guard or rebound, no hepato splenomegal no CVA tenderness.  No hernia. Extremtities:  No clubbing cyanosis or edema, no acute joint swelling or redness no focal atrophy excellent muscle mass  NEURO:  Oriented x3, cranial nerves 3-12 appear to be intact, no obvious focal weakness,gait within normal limits no abnormal reflexes or asymmetrical SKIN: No acute rashes normal turgor, color, no bruising or petechiae. PSYCH: Oriented, good eye contact, no obvious depression anxiety, cognition and judgment appear normal. LN: no cervical axillary inguinal adenopathy    BP Readings from Last 3 Encounters:  09/26/16 130/90  10/06/15 134/74  09/26/15 136/84    Lab results reviewed with patient   ASSESSMENT AND PLAN:  Discussed the following assessment and plan:  Visit for preventive health examination  Essential hypertension - Plan: Basic metabolic panel, CBC with Differential/Platelet, Hepatic function panel, Lipid panel, Hemoglobin A1c  Medication management - Plan: Basic metabolic panel, CBC with Differential/Platelet, Hepatic function panel, Lipid panel, Hemoglobin A1c  Fasting hyperglycemia - no diabetes  has been better last  year  lsi - Plan: Basic metabolic panel, CBC with Differential/Platelet, Hepatic function panel, Lipid panel, Hemoglobin A1c  Hyperlipidemia, unspecified hyperlipidemia type - Plan: Basic metabolic panel, CBC with Differential/Platelet, Hepatic function panel, Lipid panel  PULMONARY SARCOIDOSIS - stable contact pulm department about appt  Healthy   Life style continue reviewed  bp goals .    Pt prefers no new med's at this time and I agree  .can do psa next year no sx  And last psa last year.  Counseled sing up for my chart.  Patient Care Team: Madelin Headings, MD as PCP - General Rachael Fee, MD (Gastroenterology) Roslynn Amble, MD as Consulting Physician (Pulmonary Disease) Patient Instructions  Newer BP goals are    120/80 range  Attend to healthy eating and  exerecise .  Will notify you  of labs when available.   If all ok then yearly check up  Labs monitoruing.     DASH Eating Plan DASH stands for "Dietary Approaches to Stop Hypertension." The DASH eating plan is a healthy eating plan that has been shown to reduce high blood pressure (hypertension). It may also reduce your risk for type 2 diabetes, heart disease, and stroke. The DASH eating plan may also help with weight loss. What are tips for following this plan? General guidelines   Avoid eating more than 2,300 mg (milligrams) of salt (sodium) a day. If you have hypertension, you may need to reduce your sodium intake to 1,500 mg a day.  Limit alcohol intake to no more than 1 drink a day for nonpregnant women and 2 drinks a day for men. One drink equals 12 oz of beer, 5 oz of wine, or 1 oz of hard liquor.  Work with your health care provider to maintain a healthy body weight or to lose weight. Ask what an ideal weight is for you.  Get at least 30 minutes of exercise that causes your heart to beat faster (aerobic exercise) most days of the week. Activities may include walking, swimming, or biking.  Work with your  health care provider or diet and nutrition specialist (dietitian) to adjust your eating plan to your individual calorie needs. Reading food labels   Check food labels for the amount of sodium per serving. Choose foods with less than 5 percent of the Daily Value of sodium. Generally, foods with less than 300 mg of sodium per serving fit into this eating plan.  To find whole grains, look for the word "whole" as the first word in the ingredient list. Shopping   Buy products labeled as "low-sodium" or "no salt added."  Buy fresh foods. Avoid canned foods and premade or frozen meals. Cooking   Avoid adding salt when cooking. Use salt-free seasonings or herbs instead of table salt or sea salt. Check with your health care provider or pharmacist before using salt substitutes.  Do not fry foods. Cook foods using healthy methods such as baking, boiling, grilling, and broiling instead.  Cook with heart-healthy oils, such as olive, canola, soybean, or sunflower oil. Meal planning    Eat a balanced diet that includes:  5 or more servings of fruits and vegetables each day. At each meal, try to fill half of your plate with fruits and vegetables.  Up to 6-8 servings of whole grains each day.  Less than 6 oz of lean meat, poultry, or fish each day. A 3-oz serving of meat is about the same size as a deck of cards. One egg equals 1 oz.  2 servings of low-fat dairy each day.  A serving of nuts, seeds, or beans 5 times each week.  Heart-healthy fats. Healthy fats called Omega-3 fatty acids are found in foods such as flaxseeds and coldwater fish, like sardines, salmon, and mackerel.  Limit how much you eat of the following:  Canned or prepackaged foods.  Food that is high in trans fat, such as fried foods.  Food that is high in saturated fat, such as fatty meat.  Sweets, desserts, sugary drinks, and other foods with added sugar.  Full-fat dairy products.  Do not salt foods before  eating.  Try to eat at least 2 vegetarian meals each week.  Eat more home-cooked food and less restaurant, buffet, and fast food.  When eating at a restaurant, ask that your  food be prepared with less salt or no salt, if possible. What foods are recommended? The items listed may not be a complete list. Talk with your dietitian about what dietary choices are best for you. Grains  Whole-grain or whole-wheat bread. Whole-grain or whole-wheat pasta. Brown rice. Orpah Cobb. Bulgur. Whole-grain and low-sodium cereals. Pita bread. Low-fat, low-sodium crackers. Whole-wheat flour tortillas. Vegetables  Fresh or frozen vegetables (raw, steamed, roasted, or grilled). Low-sodium or reduced-sodium tomato and vegetable juice. Low-sodium or reduced-sodium tomato sauce and tomato paste. Low-sodium or reduced-sodium canned vegetables. Fruits  All fresh, dried, or frozen fruit. Canned fruit in natural juice (without added sugar). Meat and other protein foods  Skinless chicken or [place]. Ground chicken or [place]. Pork with fat trimmed off. Fish and seafood. Egg whites. Dried beans, peas, or lentils. Unsalted nuts, nut butters, and seeds. Unsalted canned beans. Lean cuts of beef with fat trimmed off. Low-sodium, lean deli meat. Dairy  Low-fat (1%) or fat-free (skim) milk. Fat-free, low-fat, or reduced-fat cheeses. Nonfat, low-sodium ricotta or cottage cheese. Low-fat or nonfat yogurt. Low-fat, low-sodium cheese. Fats and oils  Soft margarine without trans fats. Vegetable oil. Low-fat, reduced-fat, or light mayonnaise and salad dressings (reduced-sodium). Canola, safflower, olive, soybean, and sunflower oils. Avocado. Seasoning and other foods  Herbs. Spices. Seasoning mixes without salt. Unsalted popcorn and pretzels. Fat-free sweets. What foods are not recommended? The items listed may not be a complete list. Talk with your dietitian about what dietary choices are best for you. Grains  Baked goods made  with fat, such as croissants, muffins, or some breads. Dry pasta or rice meal packs. Vegetables  Creamed or fried vegetables. Vegetables in a cheese sauce. Regular canned vegetables (not low-sodium or reduced-sodium). Regular canned tomato sauce and paste (not low-sodium or reduced-sodium). Regular tomato and vegetable juice (not low-sodium or reduced-sodium). Rosita Fire. Olives. Fruits  Canned fruit in a light or heavy syrup. Fried fruit. Fruit in cream or butter sauce. Meat and other protein foods  Fatty cuts of meat. Ribs. Fried meat. Tomasa Blase. Sausage. Bologna and other processed lunch meats. Salami. Fatback. Hotdogs. Bratwurst. Salted nuts and seeds. Canned beans with added salt. Canned or smoked fish. Whole eggs or egg yolks. Chicken or [place] with skin. Dairy  Whole or 2% milk, cream, and half-and-half. Whole or full-fat cream cheese. Whole-fat or sweetened yogurt. Full-fat cheese. Nondairy creamers. Whipped toppings. Processed cheese and cheese spreads. Fats and oils  Butter. Stick margarine. Lard. Shortening. Ghee. Bacon fat. Tropical oils, such as coconut, palm kernel, or palm oil. Seasoning and other foods  Salted popcorn and pretzels. Onion salt, garlic salt, seasoned salt, table salt, and sea salt. Worcestershire sauce. Tartar sauce. Barbecue sauce. Teriyaki sauce. Soy sauce, including reduced-sodium. Steak sauce. Canned and packaged gravies. Fish sauce. Oyster sauce. Cocktail sauce. Horseradish that you find on the shelf. Ketchup. Mustard. Meat flavorings and tenderizers. Bouillon cubes. Hot sauce and Tabasco sauce. Premade or packaged marinades. Premade or packaged taco seasonings. Relishes. Regular salad dressings. Where to find more information:  National Heart, Lung, and Blood Institute: PopSteam.is  American Heart Association: www.heart.org Summary  The DASH eating plan is a healthy eating plan that has been shown to reduce high blood pressure (hypertension). It may also reduce  your risk for type 2 diabetes, heart disease, and stroke.  With the DASH eating plan, you should limit salt (sodium) intake to 2,300 mg a day. If you have hypertension, you may need to reduce your sodium intake to 1,500 mg a day.  When on the DASH eating plan, aim to eat more fresh fruits and vegetables, whole grains, lean proteins, low-fat dairy, and heart-healthy fats.  Work with your health care provider or diet and nutrition specialist (dietitian) to adjust your eating plan to your individual calorie needs. This information is not intended to replace advice given to you by your health care provider. Make sure you discuss any questions you have with your health care provider. Document Released: 05/17/2011 Document Revised: 05/21/2016 Document Reviewed: 05/21/2016 Elsevier Interactive Patient Education  2017 [employer].    [place]. Tarsha Blando M.D.

## 2016-09-26 ENCOUNTER — Ambulatory Visit (INDEPENDENT_AMBULATORY_CARE_PROVIDER_SITE_OTHER): Payer: BC Managed Care – PPO | Admitting: Internal Medicine

## 2016-09-26 ENCOUNTER — Encounter: Payer: Self-pay | Admitting: Internal Medicine

## 2016-09-26 VITALS — BP 130/90 | HR 73 | Temp 97.6°F | Ht 66.0 in | Wt 161.3 lb

## 2016-09-26 DIAGNOSIS — E785 Hyperlipidemia, unspecified: Secondary | ICD-10-CM

## 2016-09-26 DIAGNOSIS — I10 Essential (primary) hypertension: Secondary | ICD-10-CM

## 2016-09-26 DIAGNOSIS — Z79899 Other long term (current) drug therapy: Secondary | ICD-10-CM | POA: Diagnosis not present

## 2016-09-26 DIAGNOSIS — D869 Sarcoidosis, unspecified: Secondary | ICD-10-CM | POA: Diagnosis not present

## 2016-09-26 DIAGNOSIS — Z0000 Encounter for general adult medical examination without abnormal findings: Secondary | ICD-10-CM | POA: Diagnosis not present

## 2016-09-26 DIAGNOSIS — R7301 Impaired fasting glucose: Secondary | ICD-10-CM | POA: Diagnosis not present

## 2016-09-26 LAB — CBC WITH DIFFERENTIAL/PLATELET
BASOS PCT: 0.6 % (ref 0.0–3.0)
Basophils Absolute: 0 10*3/uL (ref 0.0–0.1)
EOS ABS: 0.1 10*3/uL (ref 0.0–0.7)
EOS PCT: 3.5 % (ref 0.0–5.0)
HEMATOCRIT: 44.8 % (ref 39.0–52.0)
HEMOGLOBIN: 14.8 g/dL (ref 13.0–17.0)
LYMPHS PCT: 27.5 % (ref 12.0–46.0)
Lymphs Abs: 1.1 10*3/uL (ref 0.7–4.0)
MCHC: 33.1 g/dL (ref 30.0–36.0)
MCV: 83.4 fl (ref 78.0–100.0)
Monocytes Absolute: 0.4 10*3/uL (ref 0.1–1.0)
Monocytes Relative: 11.1 % (ref 3.0–12.0)
Neutro Abs: 2.3 10*3/uL (ref 1.4–7.7)
Neutrophils Relative %: 57.3 % (ref 43.0–77.0)
Platelets: 218 10*3/uL (ref 150.0–400.0)
RBC: 5.37 Mil/uL (ref 4.22–5.81)
RDW: 13.2 % (ref 11.5–15.5)
WBC: 4.1 10*3/uL (ref 4.0–10.5)

## 2016-09-26 LAB — LIPID PANEL
CHOL/HDL RATIO: 3
CHOLESTEROL: 163 mg/dL (ref 0–200)
HDL: 56.1 mg/dL (ref >39.00)
LDL CALC: 98 mg/dL (ref 0–99)
NonHDL: 106.95
TRIGLYCERIDES: 43 mg/dL (ref 0.0–149.0)
VLDL: 8.6 mg/dL (ref 0.0–40.0)

## 2016-09-26 LAB — BASIC METABOLIC PANEL
BUN: 18 mg/dL (ref 6–23)
CHLORIDE: 103 meq/L (ref 96–112)
CO2: 31 mEq/L (ref 19–32)
CREATININE: 1.12 mg/dL (ref 0.40–1.50)
Calcium: 9.4 mg/dL (ref 8.4–10.5)
GFR: 87.05 mL/min (ref >60.00)
Glucose, Bld: 86 mg/dL (ref 70–99)
POTASSIUM: 3.8 meq/L (ref 3.5–5.1)
Sodium: 139 mEq/L (ref 135–145)

## 2016-09-26 LAB — HEPATIC FUNCTION PANEL
ALT: 28 U/L (ref 0–53)
AST: 26 U/L (ref 0–37)
Albumin: 4.7 g/dL (ref 3.5–5.2)
Alkaline Phosphatase: 84 U/L (ref 39–117)
BILIRUBIN DIRECT: 0.1 mg/dL (ref 0.0–0.3)
BILIRUBIN TOTAL: 0.7 mg/dL (ref 0.2–1.2)
Total Protein: 8 g/dL (ref 6.0–8.3)

## 2016-09-26 LAB — HEMOGLOBIN A1C: Hgb A1c MFr Bld: 6 % (ref 4.6–6.5)

## 2016-09-26 NOTE — Patient Instructions (Signed)
Newer BP goals are    120/80 range  Attend to healthy eating and  exerecise .   Will notify you  of labs when available.   If all ok then yearly check up  Labs monitoruing.     DASH Eating Plan DASH stands for "Dietary Approaches to Stop Hypertension." The DASH eating plan is a healthy eating plan that has been shown to reduce high blood pressure (hypertension). It may also reduce your risk for type 2 diabetes, heart disease, and stroke. The DASH eating plan may also help with weight loss. What are tips for following this plan? General guidelines   Avoid eating more than 2,300 mg (milligrams) of salt (sodium) a day. If you have hypertension, you may need to reduce your sodium intake to 1,500 mg a day.  Limit alcohol intake to no more than 1 drink a day for nonpregnant women and 2 drinks a day for men. One drink equals 12 oz of beer, 5 oz of wine, or 1 oz of hard liquor.  Work with your health care provider to maintain a healthy body weight or to lose weight. Ask what an ideal weight is for you.  Get at least 30 minutes of exercise that causes your heart to beat faster (aerobic exercise) most days of the week. Activities may include walking, swimming, or biking.  Work with your health care provider or diet and nutrition specialist (dietitian) to adjust your eating plan to your individual calorie needs. Reading food labels   Check food labels for the amount of sodium per serving. Choose foods with less than 5 percent of the Daily Value of sodium. Generally, foods with less than 300 mg of sodium per serving fit into this eating plan.  To find whole grains, look for the word "whole" as the first word in the ingredient list. Shopping   Buy products labeled as "low-sodium" or "no salt added."  Buy fresh foods. Avoid canned foods and premade or frozen meals. Cooking   Avoid adding salt when cooking. Use salt-free seasonings or herbs instead of table salt or sea salt. Check with your  health care provider or pharmacist before using salt substitutes.  Do not fry foods. Cook foods using healthy methods such as baking, boiling, grilling, and broiling instead.  Cook with heart-healthy oils, such as olive, canola, soybean, or sunflower oil. Meal planning    Eat a balanced diet that includes:  5 or more servings of fruits and vegetables each day. At each meal, try to fill half of your plate with fruits and vegetables.  Up to 6-8 servings of whole grains each day.  Less than 6 oz of lean meat, poultry, or fish each day. A 3-oz serving of meat is about the same size as a deck of cards. One egg equals 1 oz.  2 servings of low-fat dairy each day.  A serving of nuts, seeds, or beans 5 times each week.  Heart-healthy fats. Healthy fats called Omega-3 fatty acids are found in foods such as flaxseeds and coldwater fish, like sardines, salmon, and mackerel.  Limit how much you eat of the following:  Canned or prepackaged foods.  Food that is high in trans fat, such as fried foods.  Food that is high in saturated fat, such as fatty meat.  Sweets, desserts, sugary drinks, and other foods with added sugar.  Full-fat dairy products.  Do not salt foods before eating.  Try to eat at least 2 vegetarian meals each week.  Eat  more home-cooked food and less restaurant, buffet, and fast food.  When eating at a restaurant, ask that your food be prepared with less salt or no salt, if possible. What foods are recommended? The items listed may not be a complete list. Talk with your dietitian about what dietary choices are best for you. Grains  Whole-grain or whole-wheat bread. Whole-grain or whole-wheat pasta. Brown rice. Orpah Cobb. Bulgur. Whole-grain and low-sodium cereals. Pita bread. Low-fat, low-sodium crackers. Whole-wheat flour tortillas. Vegetables  Fresh or frozen vegetables (raw, steamed, roasted, or grilled). Low-sodium or reduced-sodium tomato and vegetable  juice. Low-sodium or reduced-sodium tomato sauce and tomato paste. Low-sodium or reduced-sodium canned vegetables. Fruits  All fresh, dried, or frozen fruit. Canned fruit in natural juice (without added sugar). Meat and other protein foods  Skinless chicken or [place]. Ground chicken or [place]. Pork with fat trimmed off. Fish and seafood. Egg whites. Dried beans, peas, or lentils. Unsalted nuts, nut butters, and seeds. Unsalted canned beans. Lean cuts of beef with fat trimmed off. Low-sodium, lean deli meat. Dairy  Low-fat (1%) or fat-free (skim) milk. Fat-free, low-fat, or reduced-fat cheeses. Nonfat, low-sodium ricotta or cottage cheese. Low-fat or nonfat yogurt. Low-fat, low-sodium cheese. Fats and oils  Soft margarine without trans fats. Vegetable oil. Low-fat, reduced-fat, or light mayonnaise and salad dressings (reduced-sodium). Canola, safflower, olive, soybean, and sunflower oils. Avocado. Seasoning and other foods  Herbs. Spices. Seasoning mixes without salt. Unsalted popcorn and pretzels. Fat-free sweets. What foods are not recommended? The items listed may not be a complete list. Talk with your dietitian about what dietary choices are best for you. Grains  Baked goods made with fat, such as croissants, muffins, or some breads. Dry pasta or rice meal packs. Vegetables  Creamed or fried vegetables. Vegetables in a cheese sauce. Regular canned vegetables (not low-sodium or reduced-sodium). Regular canned tomato sauce and paste (not low-sodium or reduced-sodium). Regular tomato and vegetable juice (not low-sodium or reduced-sodium). Rosita Fire. Olives. Fruits  Canned fruit in a light or heavy syrup. Fried fruit. Fruit in cream or butter sauce. Meat and other protein foods  Fatty cuts of meat. Ribs. Fried meat. Tomasa Blase. Sausage. Bologna and other processed lunch meats. Salami. Fatback. Hotdogs. Bratwurst. Salted nuts and seeds. Canned beans with added salt. Canned or smoked fish. Whole eggs or  egg yolks. Chicken or [place] with skin. Dairy  Whole or 2% milk, cream, and half-and-half. Whole or full-fat cream cheese. Whole-fat or sweetened yogurt. Full-fat cheese. Nondairy creamers. Whipped toppings. Processed cheese and cheese spreads. Fats and oils  Butter. Stick margarine. Lard. Shortening. Ghee. Bacon fat. Tropical oils, such as coconut, palm kernel, or palm oil. Seasoning and other foods  Salted popcorn and pretzels. Onion salt, garlic salt, seasoned salt, table salt, and sea salt. Worcestershire sauce. Tartar sauce. Barbecue sauce. Teriyaki sauce. Soy sauce, including reduced-sodium. Steak sauce. Canned and packaged gravies. Fish sauce. Oyster sauce. Cocktail sauce. Horseradish that you find on the shelf. Ketchup. Mustard. Meat flavorings and tenderizers. Bouillon cubes. Hot sauce and Tabasco sauce. Premade or packaged marinades. Premade or packaged taco seasonings. Relishes. Regular salad dressings. Where to find more information:  National Heart, Lung, and Blood Institute: PopSteam.is  American Heart Association: www.heart.org Summary  The DASH eating plan is a healthy eating plan that has been shown to reduce high blood pressure (hypertension). It may also reduce your risk for type 2 diabetes, heart disease, and stroke.  With the DASH eating plan, you should limit salt (sodium) intake to 2,300 mg a  day. If you have hypertension, you may need to reduce your sodium intake to 1,500 mg a day.  When on the DASH eating plan, aim to eat more fresh fruits and vegetables, whole grains, lean proteins, low-fat dairy, and heart-healthy fats.  Work with your health care provider or diet and nutrition specialist (dietitian) to adjust your eating plan to your individual calorie needs. This information is not intended to replace advice given to you by your health care provider. Make sure you discuss any questions you have with your health care provider. Document Released: 05/17/2011  Document Revised: 05/21/2016 Document Reviewed: 05/21/2016 Elsevier Interactive Patient Education  2017 [employer].

## 2016-09-27 ENCOUNTER — Other Ambulatory Visit: Payer: Self-pay | Admitting: Internal Medicine

## 2016-10-22 ENCOUNTER — Other Ambulatory Visit: Payer: Self-pay | Admitting: Internal Medicine

## 2017-03-05 ENCOUNTER — Other Ambulatory Visit: Payer: Self-pay | Admitting: Pulmonary Disease

## 2017-03-05 DIAGNOSIS — R0600 Dyspnea, unspecified: Secondary | ICD-10-CM

## 2017-03-06 ENCOUNTER — Ambulatory Visit: Payer: BC Managed Care – PPO | Admitting: Pulmonary Disease

## 2017-03-14 ENCOUNTER — Ambulatory Visit (INDEPENDENT_AMBULATORY_CARE_PROVIDER_SITE_OTHER): Payer: BC Managed Care – PPO | Admitting: Pulmonary Disease

## 2017-03-14 DIAGNOSIS — R0600 Dyspnea, unspecified: Secondary | ICD-10-CM | POA: Diagnosis not present

## 2017-03-14 LAB — PULMONARY FUNCTION TEST
DL/VA % pred: 123 %
DL/VA: 5.38 ml/min/mmHg/L
DLCO COR % PRED: 82 %
DLCO COR: 22.25 ml/min/mmHg
DLCO unc % pred: 80 %
DLCO unc: 21.74 ml/min/mmHg
FEF 25-75 POST: 5.11 L/s
FEF 25-75 Pre: 4.43 L/sec
FEF2575-%Change-Post: 15 %
FEF2575-%PRED-PRE: 165 %
FEF2575-%Pred-Post: 191 %
FEV1-%CHANGE-POST: 4 %
FEV1-%Pred-Post: 96 %
FEV1-%Pred-Pre: 93 %
FEV1-Post: 2.65 L
FEV1-Pre: 2.54 L
FEV1FVC-%CHANGE-POST: 0 %
FEV1FVC-%Pred-Pre: 115 %
FEV6-%Change-Post: 3 %
FEV6-%PRED-PRE: 82 %
FEV6-%Pred-Post: 85 %
FEV6-PRE: 2.78 L
FEV6-Post: 2.87 L
FEV6FVC-%PRED-PRE: 104 %
FEV6FVC-%Pred-Post: 104 %
FVC-%CHANGE-POST: 3 %
FVC-%PRED-POST: 82 %
FVC-%PRED-PRE: 79 %
FVC-POST: 2.87 L
FVC-PRE: 2.79 L
POST FEV6/FVC RATIO: 100 %
Post FEV1/FVC ratio: 92 %
Pre FEV1/FVC ratio: 91 %
Pre FEV6/FVC Ratio: 100 %
RV % pred: 77 %
RV: 1.53 L
TLC % PRED: 71 %
TLC: 4.4 L

## 2017-03-14 NOTE — Progress Notes (Signed)
PFT completed today 03/14/17.  

## 2017-03-21 ENCOUNTER — Ambulatory Visit: Payer: BC Managed Care – PPO | Admitting: Pulmonary Disease

## 2017-03-21 NOTE — Progress Notes (Signed)
Subjective:    Patient ID: Tyler Barrera, male    DOB: 06-20-1960, 56 y.o.   MRN: [id number]  C.C.:  Follow-up for Stage II Sarcoidosis.  HPI Patient last seen in my office in April 2017. He reports he did recently have an upper respiratory tract infection.   Stage II sarcoidosis: Diagnosed in 2007 with transbronchial biopsy. Not previously on treatment. He denies any new dyspnea. No recurrent cough. No wheezing. No rashes or abnormal bruising. No blurry vision, eye swelling, or eye erythema. He reports he saw his ophthalmologist this year already.    Review of Systems No chest pain, pressure, or palpitations. No fever, chills, or sweats. No syncope or near syncope. No focal weakness, numbness, or tingling.   Allergies  Allergen Reactions  . Prednisone     Nausea, "Out of it" . Note: He was also on Vicodin simultaneously    Current Outpatient Prescriptions on File Prior to Visit  Medication Sig Dispense Refill  . clobetasol ointment (TEMOVATE) 0.05 %     . KLOR-CON M20 20 MEQ tablet TAKE 1 TABLET BY MOUTH EVERY DAY 30 tablet 5  . simvastatin (ZOCOR) 40 MG tablet TAKE 1 TABLET BY MOUTH AT BEDTIME 90 tablet 2  . triamterene-hydrochlorothiazide (MAXZIDE-25) 37.5-25 MG tablet TAKE 1 TABLET BY MOUTH EVERY DAY 90 tablet 2   No current facility-administered medications on file prior to visit.     Past Medical History:  Diagnosis Date  . Hyperlipidemia   . Hypertension   . Pulmonary sarcoidosis (HCC)    Dr Shelle Iron     Past Surgical History:  Procedure Laterality Date  . BRONCHOSCOPY      Family History  Problem Relation Age of Onset  . Hypertension Mother   . Hyperlipidemia Mother   . Hyperlipidemia Father   . Hypertension Father   . Prostate cancer Father   . Kidney failure Father   . Sarcoidosis Neg Hx   . Rheumatologic disease Neg Hx   . Lung disease Neg Hx     Social History   Social History  . Marital status: Married    Spouse name: N/A  . Number of children:  N/A  . Years of education: N/A   Social History Main Topics  . Smoking status: Never Smoker  . Smokeless tobacco: Never Used  . Alcohol use 0.0 oz/week     Comment: Occasional glass of wine.  . Drug use: No  . Sexual activity: Not Asked   Other Topics Concern  . None   Social History Narrative   Household of three married   Non-smoker regular exercise   [facility] hours plus   Self-employed carpet cleaning.   No pets    Son at [employer] graduated Another teen at home.   Soc etoh  caffiene tea some soda minimal   Exercises       [facility]:   Originally from [place]. Always lived in [place]. Prior travel to [place], [place], [place], [place], [place], & [place]. He feels he has been to pretty much the entire continental 48 state. No international travel. Currently works cleaning carpets. No pets currently. No bid exposure. No mold or hot tub exposure.       Objective:   Physical Exam BP 132/74 (BP Location: Left Arm, Cuff Size: Normal)   Pulse 82   Ht 5' 6.5" (1.689 m)   Wt 164 lb 8 oz (74.6 kg)   SpO2 96%   BMI 26.15 kg/m  General:  African-American male. No distress. Comfortable. Integument:  Warm. Dry. No rash. Extremities:  No cyanosis or clubbing.  HEENT:  Moist mucus membranes. No oral ulcers. No no scleral icterus. No scleral injection. Cardiovascular:  Regular rate. No edema. Regular rhythm.  Pulmonary:  Clear to auscultation bilaterally. Normal work of breathing on room air. Abdomen: Soft. Normal bowel sounds. Nondistended.  Musculoskeletal:  Normal bulk and tone. Hand grip strength 5/5 bilaterally. No joint deformity or effusion appreciated. Neurological:  Cranial nerves 2-12 grossly in tact. No meningismus. Moving all 4 extremities equally.   PFT 03/14/17: FVC 2.79 L (79%) FEV1 2.54 L (93%) FEV1/FVC 0.91 FEF 25-75 4.43 L (165%) no bronchodilator response TLC 4.40 L (71%) RV 77% ERV 183% DLCO corrected 82%  02/16/14: FVC 2.81 L (79%) FEV1 2.57 L (91%) FEV1/FVC 0.91 FEF  25-75 4.03 L (143%) no bronchodilator response TLC 4.18 L (67%) RV 58%DLCO uncorrected 80% 12/19/10: FVC 2.94 L (66%) FEV1 2.47 L (75%) FEV1/FVC 0.84 FEF 25-75 3.34 L (98%) no bronchodilator response TLC 4.34 L (71%) RV 68% ERV 56% DLCO uncorrected 84%  IMAGING CXR PA/LAT 02/16/14 (previously reviewed by me with the patient today): Prominent bilateral interstitial markings. No pleural effusion or thickening. Low lung volumes. Heart normal in size. Mediastinum otherwise normal in contour.  CT CHEST W/O 3/30/7 (previously reviewed by me with the patient today): No pleural effusion or thickening. No pericardial effusion. Bulky mediastinal & hilar lymphadenopathy. Bilateral parenchymal nodules right greater than left with an atypical predominance. Some areas of confluence and opacification within right upper lobe as well.  CARDIAC EKG 03/22/17 (personally reviewed by me):  Normal sinus rhythm. QT 352 ms. Some flattening of the T waves progressing from the inferior to lateral leads but no ST segment elevation or depression to suggest ischemia. No conduction delays. Borderline left atrial enlargement. EKG 10/06/15 (previously reviewed by me): Normal sinus rhythm. No evidence of ischemia or conduction delay.  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 No necrosis. Stains for fungus & AFB negative.  BRONCHIAL WASH (4/11/7): Negative for malignancy.  LABS 09/26/16 CBC: 4.1/14.8/44.8/218 BMP: 139/70.8/103/31/18/1.12/86/9.4 LFT: 4.7/8.0/0.01/04/27  09/19/15 CBC: 3.9/14.9/43.8/213 BMP: 138/3.3/98/30/20/1.23/115/9.8 LFT: 4.7/7.7/1.3/76/25/22  09/13/14 CBC: 4.7/15.4/46.6/229 BMP: 142/4.2/103/29/18/1.13/96/9.9 LFT: 4.8/8.1/0.7/88/25/29    Assessment & Plan:  56 y.o. male with stage II sarcoidosis. His pulmonary function testing does continue to show mild restriction likely due to the parenchymal lung  disease from his sarcoidosis. Overall he has no symptoms related to his sarcoidosis. I reviewed his previous serum labs from April which show no evidence of active sarcoidosis either. Given his parenchymal involvement and remoteness of chest imaging I will be repeating a chest x-ray today. I instructed the patient to contact my office if he had new breathing problems or questions before his next appointment.  1. Sarcoidosis stage II: Continuing to hold on immunosuppression and prednisone. Checking chest x-ray PA/LAT today. Repeat full pulmonary function testing at follow-up appointment. Continuing yearly serum testing, EKG, and ophthalmology exam. 2. 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 Checking chest x-ray PA/LAT today. 3. Health maintenance:  Reports he previously did receive influenza vaccine. Defer remainder of the immunizations to PCP. 4. Follow-up: Return to clinic in 1 year or sooner if needed.  Donna Christen Jamison Neighbor, M.D. [facility] Pager:  [phone] After 3pm or if no response, call [phone] 9:25 AM 03/22/17

## 2017-03-22 ENCOUNTER — Ambulatory Visit (INDEPENDENT_AMBULATORY_CARE_PROVIDER_SITE_OTHER)
Admission: RE | Admit: 2017-03-22 | Discharge: 2017-03-22 | Disposition: A | Discharge status: Home / Self Care | Payer: BC Managed Care – PPO | Source: Ambulatory Visit | Attending: Pulmonary Disease | Admitting: Pulmonary Disease

## 2017-03-22 ENCOUNTER — Ambulatory Visit (INDEPENDENT_AMBULATORY_CARE_PROVIDER_SITE_OTHER): Payer: BC Managed Care – PPO | Admitting: Pulmonary Disease

## 2017-03-22 ENCOUNTER — Encounter: Payer: Self-pay | Admitting: Pulmonary Disease

## 2017-03-22 VITALS — BP 132/74 | HR 82 | Ht 66.5 in | Wt 164.5 lb

## 2017-03-22 DIAGNOSIS — J984 Other disorders of lung: Secondary | ICD-10-CM | POA: Diagnosis not present

## 2017-03-22 DIAGNOSIS — D869 Sarcoidosis, unspecified: Secondary | ICD-10-CM

## 2017-03-22 NOTE — Patient Instructions (Signed)
   Call us if you have any new breathing problems or questions.  We will contact you with your x-ray result after I've reviewed it.  TESTS ORDERED: 1. CXR PA/LAT TODAY 2. Full PFTs at next appointment

## 2017-03-26 ENCOUNTER — Other Ambulatory Visit: Payer: Self-pay | Admitting: Internal Medicine

## 2017-04-03 ENCOUNTER — Other Ambulatory Visit: Payer: Self-pay | Admitting: Internal Medicine
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
## 2017-07-29 ENCOUNTER — Other Ambulatory Visit: Payer: Self-pay | Admitting: Internal Medicine

## 2017-07-30 ENCOUNTER — Other Ambulatory Visit: Payer: Self-pay | Admitting: Internal Medicine

## 2017-07-30 MED ORDER — POTASSIUM CHLORIDE CRYS ER 20 MEQ PO TBCR: 20.0000 meq | EXTENDED_RELEASE_TABLET | Freq: Every day | ORAL | 1 refills | Status: DC

## 2017-08-27 ENCOUNTER — Other Ambulatory Visit: Payer: Self-pay | Admitting: *Deleted

## 2017-08-27 NOTE — Telephone Encounter (Signed)
error 

## 2017-10-03 ENCOUNTER — Other Ambulatory Visit: Payer: Self-pay | Admitting: Internal Medicine

## 2017-10-03 NOTE — Telephone Encounter (Signed)
Attempted to call pt to schedule a CPE but voicemail is full and no way to leave a message. Will try later

## 2017-10-04 NOTE — Telephone Encounter (Signed)
A CRM has been created 

## 2017-10-08 ENCOUNTER — Other Ambulatory Visit: Payer: Self-pay | Admitting: Internal Medicine

## 2017-10-24 ENCOUNTER — Other Ambulatory Visit: Payer: Self-pay | Admitting: Internal Medicine

## 2017-11-10 ENCOUNTER — Other Ambulatory Visit: Payer: Self-pay | Admitting: Internal Medicine

## 2017-11-12 NOTE — Telephone Encounter (Signed)
Pt due for Annual Visit 09/2017 30 day supply sent to pharmacy with note to schedule appt. Nothing further needed.

## 2017-11-29 ENCOUNTER — Other Ambulatory Visit: Payer: Self-pay | Admitting: Internal Medicine

## 2017-12-03 ENCOUNTER — Other Ambulatory Visit: Payer: Self-pay | Admitting: Internal Medicine

## 2017-12-04 NOTE — Telephone Encounter (Signed)
Sent to the pharmacy by e-scribe for 90 days.  Pt has upcoming cpx on 01/08/18.

## 2018-01-07 NOTE — Progress Notes (Signed)
Chief Complaint  Patient presents with  . Annual Exam    No new concerns    HPI: Patient  Tyler Barrera  57 y.o. comes in today for Preventive Health Care visit  And Chronic disease management    Bp 130 high 80s at the pharmacy .taking med and potassium   Lipid  Taking meds   Resp:  Colder  Cough  Not badly  ses pulm once a year.   ongoing off and on right shoulder arm pain and irhgt hip pain  Since having to do more lifting but no injurt per se  Is r handed   Shoulder  Back   2012.    woracting up  righ shoulder . At night.    Health Maintenance  Topic Date Due  . Hepatitis C Screening  November 15, 1960  . PNEUMOCOCCAL POLYSACCHARIDE VACCINE (1) 03/17/1962  . HIV Screening  03/18/1975  . TETANUS/TDAP  06/11/2016  . HEMOGLOBIN A1C  03/28/2017  . INFLUENZA VACCINE  01/09/2018  . COLONOSCOPY  08/28/2020   Health Maintenance Review LIFESTYLE:  Exercise:  Physical job.  Tobacco/ETS: no Alcohol: ocass Sugar beverages:  Not daily   Sleep: 6-7  Drug use: no HH of  3  No pets  Work: a lot 50 +  ROS:  GEN/ HEENT: No fever, significant weight changes sweats headaches vision problems hearing changes, CV/ PULM; No chest pain shortness of breath cough, syncope,edema  change in exercise tolerance. GI /GU: No adominal pain, vomiting, change in bowel habits. No blood in the stool. No significant GU symptoms. SKIN/HEME: ,no acute skin rashes suspicious lesions or bleeding. No lymphadenopathy, nodules, masses.  NEURO/ PSYCH:  No neurologic signs such as weakness numbness. No depression anxiety. IMM/ Allergy: No unusual infections.  Allergy .   REST of 12 system review negative except as per HPI   Past Medical History:  Diagnosis Date  . Hyperlipidemia   . Hypertension   . Pulmonary sarcoidosis (HCC)    Dr Shelle Iron     Past Surgical History:  Procedure Laterality Date  . BRONCHOSCOPY      Family History  Problem Relation Age of Onset  . Hypertension Mother   .  Hyperlipidemia Mother   . Hyperlipidemia Father   . Hypertension Father   . Prostate cancer Father   . Kidney failure Father   . Sarcoidosis Neg Hx   . Rheumatologic disease Neg Hx   . Lung disease Neg Hx     Social History   Socioeconomic History  . Marital status: Married    Spouse name: Not on file  . Number of children: Not on file  . Years of education: Not on file  . Highest education level: Not on file  Occupational History  . Not on file  Social Needs  . Financial resource strain: Not on file  . Food insecurity:    Worry: Not on file    Inability: Not on file  . Transportation needs:    Medical: Not on file    Non-medical: Not on file  Tobacco Use  . Smoking status: Never Smoker  . Smokeless tobacco: Never Used  Substance and Sexual Activity  . Alcohol use: Yes    Alcohol/week: 0.0 oz    Comment: Occasional glass of wine.  . Drug use: No  . Sexual activity: Not on file  Lifestyle  . Physical activity:    Days per week: Not on file    Minutes per session: Not on file  .  Stress: Not on file  Relationships  . Social connections:    Talks on phone: Not on file    Gets together: Not on file    Attends religious service: Not on file    Active member of club or organization: Not on file    Attends meetings of clubs or organizations: Not on file    Relationship status: Not on file  Other Topics Concern  . Not on file  Social History Narrative   Household of three married   Non-smoker regular exercise   [facility]   Self-employed carpet cleaning.   No pets    Son at [employer] [place] graduated Another teen at home.   Soc etoh  caffiene tea some soda minimal   Exercises       [facility]:   Originally from [place]. Always lived in [place]. Prior travel to [place], [place], [place], [place], [place], & [place]. He feels he has been to pretty much the entire continental 48 state. No international travel. Currently works cleaning carpets. No pets currently. No bid  exposure. No mold or hot tub exposure.     Outpatient Medications Prior to Visit  Medication Sig Dispense Refill  . clobetasol ointment (TEMOVATE) 0.05 %     . potassium chloride SA (KLOR-CON M20) 20 MEQ tablet Take 1 tablet (20 mEq total) by mouth daily. 30 tablet 1  . simvastatin (ZOCOR) 40 MG tablet TAKE 1 TABLET BY MOUTH EVERYDAY AT BEDTIME 90 tablet 0  . triamterene-hydrochlorothiazide (MAXZIDE-25) 37.5-25 MG tablet TAKE 1 TABLET BY MOUTH EVERY DAY 90 tablet 0  . potassium chloride SA (KLOR-CON M20) 20 MEQ tablet Take 1 tablet (20 mEq total) by mouth daily. **DUE FOR ANNUAL VISIT, NO FURTHER REFILLS (Patient not taking: Reported on 01/08/2018) 30 tablet 0  . potassium chloride SA (KLOR-CON M20) 20 MEQ tablet Take 1 tablet (20 mEq total) by mouth daily. **DUE FOR ANNUAL VISIT AND LABS (Patient not taking: Reported on 01/08/2018) 30 tablet 0   No facility-administered medications prior to visit.      EXAM:  BP 128/80 (BP Location: Right Arm, Patient Position: Sitting, Cuff Size: Normal)   Pulse 80   Temp 97.7 F (36.5 C) (Oral)   Ht 5' 5.5" (1.664 m)   Wt 162 lb 9.6 oz (73.8 kg)   BMI 26.65 kg/m   Body mass index is 26.65 kg/m. Wt Readings from Last 3 Encounters:  01/08/18 162 lb 9.6 oz (73.8 kg)  03/22/17 164 lb 8 oz (74.6 kg)  09/26/16 161 lb 4.8 oz (73.2 kg)    Physical Exam: Vital signs reviewed ZOX:WRUEGEN:This is a well-developed well-nourished alert cooperative    who appearsr stated age in no acute distress.  HEENT: normocephalic atraumatic , Eyes: PERRL EOM's full, conjunctiva clear, Nares: paten,t no deformity discharge or tenderness., Ears: no deformity EAC's clear TMs with normal landmarks. Mouth: clear OP, no lesions, edema.  Moist mucous membranes. Dentition in adequate repair. NECK: supple without masses, thyromegaly or bruits. CHEST/PULM:  Clear to auscultation and percussion breath sounds equal no wheeze , rales or rhonchi. No chest wall deformities or  tenderness. CV: PMI is nondisplaced, S1 S2 no gallops, murmurs, rubs. Peripheral pulses are full without delay.No JVD .  ABDOMEN: Bowel sounds normal nontender  No guard or rebound, no hepato splenomegal no CVA tenderness.  No hernia. Extremtities:  No clubbing cyanosis or edema, no acute joint swelling or redness no focal atrophy NEURO:  Oriented x3, cranial nerves  3-12 appear to be intact, no obvious focal weakness,gait within normal limits no abnormal reflexes or asymmetrical SKIN: No acute rashes normal turgor, color, no bruising or petechiae. PSYCH: Oriented, good eye contact, no obvious depression anxiety, cognition and judgment appear normal. LN: no cervical axillary inguinal adenopathy    BP Readings from Last 3 Encounters:  01/08/18 128/80  03/22/17 132/74  09/26/16 130/90    ASSESSMENT AND PLAN:  Discussed the following assessment and plan:  Visit for preventive health examination - Plan: Basic metabolic panel, CBC with Differential/Platelet, Hemoglobin A1c, Hepatic function panel, Lipid panel, TSH, PSA, Hepatitis C antibody, HIV antibody  Medication management - Plan: Basic metabolic panel, CBC with Differential/Platelet, Hemoglobin A1c, Hepatic function panel, Lipid panel, TSH, PSA, Hepatitis C antibody, HIV antibody  PULMONARY SARCOIDOSIS - Plan: Basic metabolic panel, CBC with Differential/Platelet, Hemoglobin A1c, Hepatic function panel, Lipid panel, TSH, PSA, Hepatitis C antibody, HIV antibody  Essential hypertension - Plan: Basic metabolic panel, CBC with Differential/Platelet, Hemoglobin A1c, Hepatic function panel, Lipid panel, TSH, PSA, Hepatitis C antibody, HIV antibody  Hyperglycemia - Plan: Basic metabolic panel, CBC with Differential/Platelet, Hemoglobin A1c, Hepatic function panel, Lipid panel, TSH, PSA, Hepatitis C antibody, HIV antibody  Hyperlipidemia, unspecified hyperlipidemia type - Plan: Basic metabolic panel, CBC with Differential/Platelet, Hemoglobin  A1c, Hepatic function panel, Lipid panel, TSH, PSA, Hepatitis C antibody, HIV antibody  Hypokalemia - Plan: Basic metabolic panel, CBC with Differential/Platelet, Hemoglobin A1c, Hepatic function panel, Lipid panel, TSH, PSA, Hepatitis C antibody, HIV antibody  Screening PSA (prostate specific antigen) - Plan: PSA  Need for hepatitis C screening test - Plan: Hepatitis C antibody  Screening for HIV (human immunodeficiency virus) - Plan: HIV antibody  Right shoulder pain, unspecified chronicity - Plan: Ambulatory referral to Sports Medicine  Right hip pain - Plan: Ambulatory referral to Sports Medicine Shared Decision Making Labs psa  reviewed options  Wait on pneumonia vaccine for now  To see pulmonary  Patient Care Team: Madelin Headings, MD as PCP - General Rachael Fee, MD (Gastroenterology) Roslynn Amble, MD as Consulting Physician (Pulmonary Disease) Patient Instructions  Your exam is good today  You will be contact about sports medicine appt for the shoulder and hip pain .    Continue lifestyle intervention healthy eating and exercise . Will notify you  of labs when available.     Health Maintenance, Male A healthy lifestyle and preventive care is important for your health and wellness. Ask your health care provider about what schedule of regular examinations is right for you. What should I know about weight and diet? Eat a Healthy Diet  Eat plenty of vegetables, fruits, whole grains, low-fat dairy products, and lean protein.  Do not eat a lot of foods high in solid fats, added sugars, or salt.  Maintain a Healthy Weight Regular exercise can help you achieve or maintain a healthy weight. You should:  Do at least 150 minutes of exercise each week. The exercise should increase your heart rate and make you sweat (moderate-intensity exercise).  Do strength-training exercises at least twice a week.  Watch Your Levels of Cholesterol and Blood Lipids  Have your  blood tested for lipids and cholesterol every 5 years starting at 58 years of age. If you are at high risk for heart disease, you should start having your blood tested when you are 58 years old. You may need to have your cholesterol levels checked more often if: ? Your lipid or cholesterol levels are high. ?  You are older than 58 years of age. ? You are at high risk for heart disease.  What should I know about cancer screening? Many types of cancers can be detected early and may often be prevented. Lung Cancer  You should be screened every year for lung cancer if: ? You are a current smoker who has smoked for at least 30 years. ? You are a former smoker who has quit within the past 15 years.  Talk to your health care provider about your screening options, when you should start screening, and how often you should be screened.  Colorectal Cancer  Routine colorectal cancer screening usually begins at 58 years of age and should be repeated every 5-10 years until you are 58 years old. You may need to be screened more often if early forms of precancerous polyps or small growths are found. Your health care provider may recommend screening at an earlier age if you have risk factors for colon cancer.  Your health care provider may recommend using home test kits to check for hidden blood in the stool.  A small camera at the end of a tube can be used to examine your colon (sigmoidoscopy or colonoscopy). This checks for the earliest forms of colorectal cancer.  Prostate and Testicular Cancer  Depending on your age and overall health, your health care provider may do certain tests to screen for prostate and testicular cancer.  Talk to your health care provider about any symptoms or concerns you have about testicular or prostate cancer.  Skin Cancer  Check your skin from head to toe regularly.  Tell your health care provider about any new moles or changes in moles, especially if: ? There is a  change in a mole's size, shape, or color. ? You have a mole that is larger than a pencil eraser.  Always use sunscreen. Apply sunscreen liberally and repeat throughout the day.  Protect yourself by wearing long sleeves, pants, a wide-brimmed hat, and sunglasses when outside.  What should I know about heart disease, diabetes, and high blood pressure?  If you are 47-61 years of age, have your blood pressure checked every 3-5 years. If you are 10 years of age or older, have your blood pressure checked every year. You should have your blood pressure measured twice-once when you are at a hospital or clinic, and once when you are not at a hospital or clinic. Record the average of the two measurements. To check your blood pressure when you are not at a hospital or clinic, you can use: ? An automated blood pressure machine at a pharmacy. ? A home blood pressure monitor.  Talk to your health care provider about your target blood pressure.  If you are between 7-45 years old, ask your health care provider if you should take aspirin to prevent heart disease.  Have regular diabetes screenings by checking your fasting blood sugar level. ? If you are at a normal weight and have a low risk for diabetes, have this test once every three years after the age of 46. ? If you are overweight and have a high risk for diabetes, consider being tested at a younger age or more often.  A one-time screening for abdominal aortic aneurysm (AAA) by ultrasound is recommended for men aged 65-75 years who are current or former smokers. What should I know about preventing infection? Hepatitis B If you have a higher risk for hepatitis B, you should be screened for this virus. Talk with  your health care provider to find out if you are at risk for hepatitis B infection. Hepatitis C Blood testing is recommended for:  Everyone born from [birth year] through 1966.  Anyone with known risk factors for hepatitis C.  Sexually Transmitted  Diseases (STDs)  You should be screened each year for STDs including gonorrhea and chlamydia if: ? You are sexually active and are younger than 58 years of age. ? You are older than 58 years of age and your health care provider tells you that you are at risk for this type of infection. ? Your sexual activity has changed since you were last screened and you are at an increased risk for chlamydia or gonorrhea. Ask your health care provider if you are at risk.  Talk with your health care provider about whether you are at high risk of being infected with HIV. Your health care provider may recommend a prescription medicine to help prevent HIV infection.  What else can I do?  Schedule regular health, dental, and eye exams.  Stay current with your vaccines (immunizations).  Do not use any tobacco products, such as cigarettes, chewing tobacco, and e-cigarettes. If you need help quitting, ask your health care provider.  Limit alcohol intake to no more than 2 drinks per day. One drink equals 12 ounces of beer, 5 ounces of wine, or 1 ounces of hard liquor.  Do not use street drugs.  Do not share needles.  Ask your health care provider for help if you need support or information about quitting drugs.  Tell your health care provider if you often feel depressed.  Tell your health care provider if you have ever been abused or do not feel safe at home. This information is not intended to replace advice given to you by your health care provider. Make sure you discuss any questions you have with your health care provider. Document Released: 11/24/2007 Document Revised: 01/25/2016 Document Reviewed: 03/01/2015 Elsevier Interactive Patient Education  2018 [employer].      [place]. Panosh M.D.

## 2018-01-08 ENCOUNTER — Ambulatory Visit (INDEPENDENT_AMBULATORY_CARE_PROVIDER_SITE_OTHER): Payer: BC Managed Care – PPO | Admitting: Internal Medicine

## 2018-01-08 ENCOUNTER — Encounter: Payer: Self-pay | Admitting: Internal Medicine

## 2018-01-08 VITALS — BP 128/80 | HR 80 | Temp 97.7°F | Ht 65.5 in | Wt 162.6 lb

## 2018-01-08 DIAGNOSIS — M25551 Pain in right hip: Secondary | ICD-10-CM

## 2018-01-08 DIAGNOSIS — R739 Hyperglycemia, unspecified: Secondary | ICD-10-CM

## 2018-01-08 DIAGNOSIS — E785 Hyperlipidemia, unspecified: Secondary | ICD-10-CM

## 2018-01-08 DIAGNOSIS — D869 Sarcoidosis, unspecified: Secondary | ICD-10-CM | POA: Diagnosis not present

## 2018-01-08 DIAGNOSIS — I10 Essential (primary) hypertension: Secondary | ICD-10-CM

## 2018-01-08 DIAGNOSIS — Z79899 Other long term (current) drug therapy: Secondary | ICD-10-CM | POA: Diagnosis not present

## 2018-01-08 DIAGNOSIS — Z125 Encounter for screening for malignant neoplasm of prostate: Secondary | ICD-10-CM

## 2018-01-08 DIAGNOSIS — Z0000 Encounter for general adult medical examination without abnormal findings: Secondary | ICD-10-CM | POA: Diagnosis not present

## 2018-01-08 DIAGNOSIS — Z1159 Encounter for screening for other viral diseases: Secondary | ICD-10-CM | POA: Diagnosis not present

## 2018-01-08 DIAGNOSIS — M25511 Pain in right shoulder: Secondary | ICD-10-CM

## 2018-01-08 DIAGNOSIS — Z114 Encounter for screening for human immunodeficiency virus [HIV]: Secondary | ICD-10-CM

## 2018-01-08 DIAGNOSIS — E876 Hypokalemia: Secondary | ICD-10-CM

## 2018-01-08 LAB — BASIC METABOLIC PANEL
BUN: 17 mg/dL (ref 6–23)
CALCIUM: 9.8 mg/dL (ref 8.4–10.5)
CO2: 32 meq/L (ref 19–32)
CREATININE: 1.23 mg/dL (ref 0.40–1.50)
Chloride: 102 mEq/L (ref 96–112)
GFR: 77.78 mL/min (ref >60.00)
Glucose, Bld: 97 mg/dL (ref 70–99)
Potassium: 4 mEq/L (ref 3.5–5.1)
SODIUM: 140 meq/L (ref 135–145)

## 2018-01-08 LAB — CBC WITH DIFFERENTIAL/PLATELET
BASOS ABS: 0 10*3/uL (ref 0.0–0.1)
Basophils Relative: 0.6 % (ref 0.0–3.0)
EOS ABS: 0.1 10*3/uL (ref 0.0–0.7)
Eosinophils Relative: 2.8 % (ref 0.0–5.0)
HEMATOCRIT: 45.5 % (ref 39.0–52.0)
Hemoglobin: 15.1 g/dL (ref 13.0–17.0)
Lymphocytes Relative: 27.5 % (ref 12.0–46.0)
Lymphs Abs: 1 10*3/uL (ref 0.7–4.0)
MCHC: 33.1 g/dL (ref 30.0–36.0)
MCV: 83.8 fl (ref 78.0–100.0)
MONOS PCT: 13.3 % — AB (ref 3.0–12.0)
Monocytes Absolute: 0.5 10*3/uL (ref 0.1–1.0)
Neutro Abs: 2 10*3/uL (ref 1.4–7.7)
Neutrophils Relative %: 55.8 % (ref 43.0–77.0)
PLATELETS: 213 10*3/uL (ref 150.0–400.0)
RBC: 5.43 Mil/uL (ref 4.22–5.81)
RDW: 13.9 % (ref 11.5–15.5)
WBC: 3.6 10*3/uL — AB (ref 4.0–10.5)

## 2018-01-08 LAB — LIPID PANEL
CHOLESTEROL: 156 mg/dL (ref 0–200)
HDL: 51.3 mg/dL (ref >39.00)
LDL CALC: 91 mg/dL (ref 0–99)
NonHDL: 104.3
TRIGLYCERIDES: 65 mg/dL (ref 0.0–149.0)
Total CHOL/HDL Ratio: 3
VLDL: 13 mg/dL (ref 0.0–40.0)

## 2018-01-08 LAB — PSA: PSA: 0.88 ng/mL (ref 0.10–4.00)

## 2018-01-08 LAB — HEPATIC FUNCTION PANEL
ALT: 23 U/L (ref 0–53)
AST: 21 U/L (ref 0–37)
Albumin: 4.9 g/dL (ref 3.5–5.2)
Alkaline Phosphatase: 77 U/L (ref 39–117)
BILIRUBIN TOTAL: 0.8 mg/dL (ref 0.2–1.2)
Bilirubin, Direct: 0.2 mg/dL (ref 0.0–0.3)
Total Protein: 7.5 g/dL (ref 6.0–8.3)

## 2018-01-08 LAB — HEMOGLOBIN A1C: Hgb A1c MFr Bld: 6.1 % (ref 4.6–6.5)

## 2018-01-08 LAB — TSH: TSH: 1.11 u[IU]/mL (ref 0.35–4.50)

## 2018-01-08 NOTE — Patient Instructions (Addendum)
Your exam is good today  You will be contact about sports medicine appt for the shoulder and hip pain .    Continue lifestyle intervention healthy eating and exercise . Will notify you  of labs when available.     Health Maintenance, Male A healthy lifestyle and preventive care is important for your health and wellness. Ask your health care provider about what schedule of regular examinations is right for you. What should I know about weight and diet? Eat a Healthy Diet  Eat plenty of vegetables, fruits, whole grains, low-fat dairy products, and lean protein.  Do not eat a lot of foods high in solid fats, added sugars, or salt.  Maintain a Healthy Weight Regular exercise can help you achieve or maintain a healthy weight. You should:  Do at least 150 minutes of exercise each week. The exercise should increase your heart rate and make you sweat (moderate-intensity exercise).  Do strength-training exercises at least twice a week.  Watch Your Levels of Cholesterol and Blood Lipids  Have your blood tested for lipids and cholesterol every 5 years starting at 58 years of age. If you are at high risk for heart disease, you should start having your blood tested when you are 58 years old. You may need to have your cholesterol levels checked more often if: ? Your lipid or cholesterol levels are high. ? You are older than 58 years of age. ? You are at high risk for heart disease.  What should I know about cancer screening? Many types of cancers can be detected early and may often be prevented. Lung Cancer  You should be screened every year for lung cancer if: ? You are a current smoker who has smoked for at least 30 years. ? You are a former smoker who has quit within the past 15 years.  Talk to your health care provider about your screening options, when you should start screening, and how often you should be screened.  Colorectal Cancer  Routine colorectal cancer screening usually  begins at 58 years of age and should be repeated every 5-10 years until you are 58 years old. You may need to be screened more often if early forms of precancerous polyps or small growths are found. Your health care provider may recommend screening at an earlier age if you have risk factors for colon cancer.  Your health care provider may recommend using home test kits to check for hidden blood in the stool.  A small camera at the end of a tube can be used to examine your colon (sigmoidoscopy or colonoscopy). This checks for the earliest forms of colorectal cancer.  Prostate and Testicular Cancer  Depending on your age and overall health, your health care provider may do certain tests to screen for prostate and testicular cancer.  Talk to your health care provider about any symptoms or concerns you have about testicular or prostate cancer.  Skin Cancer  Check your skin from head to toe regularly.  Tell your health care provider about any new moles or changes in moles, especially if: ? There is a change in a mole's size, shape, or color. ? You have a mole that is larger than a pencil eraser.  Always use sunscreen. Apply sunscreen liberally and repeat throughout the day.  Protect yourself by wearing long sleeves, pants, a wide-brimmed hat, and sunglasses when outside.  What should I know about heart disease, diabetes, and high blood pressure?  If you are 18-39 years of  age, have your blood pressure checked every 3-5 years. If you are 58 years of age or older, have your blood pressure checked every year. You should have your blood pressure measured twice-once when you are at a hospital or clinic, and once when you are not at a hospital or clinic. Record the average of the two measurements. To check your blood pressure when you are not at a hospital or clinic, you can use: ? An automated blood pressure machine at a pharmacy. ? A home blood pressure monitor.  Talk to your health care  provider about your target blood pressure.  If you are between 3545-58 years old, ask your health care provider if you should take aspirin to prevent heart disease.  Have regular diabetes screenings by checking your fasting blood sugar level. ? If you are at a normal weight and have a low risk for diabetes, have this test once every three years after the age of 58. ? If you are overweight and have a high risk for diabetes, consider being tested at a younger age or more often.  A one-time screening for abdominal aortic aneurysm (AAA) by ultrasound is recommended for men aged 65-75 years who are current or former smokers. What should I know about preventing infection? Hepatitis B If you have a higher risk for hepatitis B, you should be screened for this virus. Talk with your health care provider to find out if you are at risk for hepatitis B infection. Hepatitis C Blood testing is recommended for:  Everyone born from [id number] through 1966.  Anyone with known risk factors for hepatitis C.  Sexually Transmitted Diseases (STDs)  You should be screened each year for STDs including gonorrhea and chlamydia if: ? You are sexually active and are younger than 58 years of age. ? You are older than 58 years of age and your health care provider tells you that you are at risk for this type of infection. ? Your sexual activity has changed since you were last screened and you are at an increased risk for chlamydia or gonorrhea. Ask your health care provider if you are at risk.  Talk with your health care provider about whether you are at high risk of being infected with HIV. Your health care provider may recommend a prescription medicine to help prevent HIV infection.  What else can I do?  Schedule regular health, dental, and eye exams.  Stay current with your vaccines (immunizations).  Do not use any tobacco products, such as cigarettes, chewing tobacco, and e-cigarettes. If you need help quitting, ask  your health care provider.  Limit alcohol intake to no more than 2 drinks per day. One drink equals 12 ounces of beer, 5 ounces of wine, or 1 ounces of hard liquor.  Do not use street drugs.  Do not share needles.  Ask your health care provider for help if you need support or information about quitting drugs.  Tell your health care provider if you often feel depressed.  Tell your health care provider if you have ever been abused or do not feel safe at home. This information is not intended to replace advice given to you by your health care provider. Make sure you discuss any questions you have with your health care provider. Document Released: 11/24/2007 Document Revised: 01/25/2016 Document Reviewed: 03/01/2015 Elsevier Interactive Patient Education  [employer]2018 Elsevier Inc[employer]

## 2018-01-09 ENCOUNTER — Encounter: Payer: Self-pay | Admitting: *Deleted

## 2018-01-09 LAB — HIV ANTIBODY (ROUTINE TESTING W REFLEX): HIV: NONREACTIVE

## 2018-01-09 LAB — HEPATITIS C ANTIBODY
Hepatitis C Ab: NONREACTIVE
SIGNAL TO CUT-OFF: 0 (ref <1.00)

## 2018-01-21 NOTE — Progress Notes (Deleted)
Tawana ScaleZach Oliwia Berzins D.O. [facility] [address] [place], [place] [address] Phone: [phone] Subjective:    I'm seeing this patient by the request  of:    CC:   AOZ:HYQMVHQIONHPI:Subjective  Alvy BimlerJames Mcgue is a 57 y.o. male coming in with complaint of ***  Onset-  Location Duration-  Character- Aggravating factors- Reliving factors-  Therapies tried-  Severity-     Past Medical History:  Diagnosis Date  . Hyperlipidemia   . Hypertension   . Pulmonary sarcoidosis (HCC)    Dr Shelle Ironlance    Past Surgical History:  Procedure Laterality Date  . BRONCHOSCOPY     Social History   Socioeconomic History  . Marital status: Married    Spouse name: Not on file  . Number of children: Not on file  . Years of education: Not on file  . Highest education level: Not on file  Occupational History  . Not on file  Social Needs  . Financial resource strain: Not on file  . Food insecurity:    Worry: Not on file    Inability: Not on file  . Transportation needs:    Medical: Not on file    Non-medical: Not on file  Tobacco Use  . Smoking status: Never Smoker  . Smokeless tobacco: Never Used  Substance and Sexual Activity  . Alcohol use: Yes    Alcohol/week: 0.0 standard drinks    Comment: Occasional glass of wine.  . Drug use: No  . Sexual activity: Not on file  Lifestyle  . Physical activity:    Days per week: Not on file    Minutes per session: Not on file  . Stress: Not on file  Relationships  . Social connections:    Talks on phone: Not on file    Gets together: Not on file    Attends religious service: Not on file    Active member of club or organization: Not on file    Attends meetings of clubs or organizations: Not on file    Relationship status: Not on file  Other Topics Concern  . Not on file  Social History Narrative   Household of three married   Non-smoker regular exercise   [employer] [facility]   Self-employed carpet cleaning.   No pets      Son at [employer] [place] Another teen at home.   Soc etoh  caffiene tea some soda minimal   Exercises       [facility]:   Originally from [place] Always lived in [place]. Prior travel to [place], [place], [place], [place], [place], & [place]. He feels he has been to pretty much the entire continental [place]. No international travel. Currently works cleaning carpets. No pets currently. No bid exposure. No mold or hot tub exposure.    Allergies  Allergen Reactions  . Prednisone     Nausea, "Out of it" . Note: He was also on Vicodin simultaneously   Family History  Problem Relation Age of Onset  . Hypertension Mother   . Hyperlipidemia Mother   . Hyperlipidemia Father   . Hypertension Father   . Prostate cancer Father   . Kidney failure Father   . Sarcoidosis Neg Hx   . Rheumatologic disease Neg Hx   . Lung disease Neg Hx      Past medical history, social, surgical and family history all reviewed in electronic medical record.  No pertanent information unless stated regarding to the chief complaint.  Review of Systems:Review of systems updated and as accurate as of 01/21/18  No headache, visual changes, nausea, vomiting, diarrhea, constipation, dizziness, abdominal pain, skin rash, fevers, chills, night sweats, weight loss, swollen lymph nodes, body aches, joint swelling, muscle aches, chest pain, shortness of breath, mood changes.   Objective  There were no vitals taken for this visit. Systems examined below as of 01/21/18   General: No apparent distress alert and oriented x3 mood and affect normal, dressed appropriately.  HEENT: Pupils equal, extraocular movements intact  Respiratory: Patient's speak in full sentences and does not appear short of breath  Cardiovascular: No lower extremity edema, non tender, no erythema  Skin: Warm dry intact with no signs of infection or rash on extremities or on axial skeleton.  Abdomen: Soft nontender  Neuro: Cranial nerves II through XII are intact, neurovascularly  intact in all extremities with 2+ DTRs and 2+ pulses.  Lymph: No lymphadenopathy of posterior or anterior cervical chain or axillae bilaterally.  Gait normal with good balance and coordination.  MSK:  Non tender with full range of motion and good stability and symmetric strength and tone of shoulders, elbows, wrist, hip, knee and ankles bilaterally.     Impression and Recommendations:     This case required medical decision making of moderate complexity.      Note: This dictation was prepared with Dragon dictation along with smaller phrase technology. Any transcriptional errors that result from this process are unintentional.

## 2018-01-22 ENCOUNTER — Ambulatory Visit: Payer: BC Managed Care – PPO | Admitting: Family Medicine

## 2018-01-27 NOTE — Progress Notes (Signed)
Tawana ScaleZach Gailyn Crook D.O. [facility] [address] [place], [place] [address] Phone: [phone] Subjective:    I'm seeing this patient by the request  of:  Panosh, Neta MendsWanda K, MD   CC: Back pain   OZH:YQMVHQIONGHPI:Subjective  Tyler BimlerJames Cavitt is a 57 y.o. male coming in with complaint of back pain. No numbness or tingling noted. Increased his lifting for work back in 2012 which is what caused the pain. Keeps him up at night. Sleeps with pillow between his legs.   Onset- 2012 Location- Lateral right hip and the back of the leg Duration-6 daily Character- Achy Aggravating factors- Phyiscal Reliving factors-increasing lifting and repetitive motions Therapies tried-icing, rest and some stretching Severity-6 out of 10     Past Medical History:  Diagnosis Date  . Hyperlipidemia   . Hypertension   . Pulmonary sarcoidosis (HCC)    Dr Shelle Ironlance    Past Surgical History:  Procedure Laterality Date  . BRONCHOSCOPY     Social History   Socioeconomic History  . Marital status: Married    Spouse name: Not on file  . Number of children: Not on file  . Years of education: Not on file  . Highest education level: Not on file  Occupational History  . Not on file  Social Needs  . Financial resource strain: Not on file  . Food insecurity:    Worry: Not on file    Inability: Not on file  . Transportation needs:    Medical: Not on file    Non-medical: Not on file  Tobacco Use  . Smoking status: Never Smoker  . Smokeless tobacco: Never Used  Substance and Sexual Activity  . Alcohol use: Yes    Alcohol/week: 0.0 standard drinks    Comment: Occasional glass of wine.  . Drug use: No  . Sexual activity: Not on file  Lifestyle  . Physical activity:    Days per week: Not on file    Minutes per session: Not on file  . Stress: Not on file  Relationships  . Social connections:    Talks on phone: Not on file    Gets together: Not on file    Attends religious service: Not on file    Active member  of club or organization: Not on file    Attends meetings of clubs or organizations: Not on file    Relationship status: Not on file  Other Topics Concern  . Not on file  Social History Narrative   Household of three married   Non-smoker regular exercise   [facility] plus   Self-employed carpet cleaning.   No pets    Son at [employer] graduated Another teen at home.   Soc etoh  caffiene tea some soda minimal   Exercises       [facility]:   Originally from [place]. Always lived in [place]. Prior travel to [place], [place], [place], [place], & [place]. He feels he has been to pretty much the entire continental [place] state. No international travel. Currently works cleaning carpets. No pets currently. No bid exposure. No mold or hot tub exposure.    Allergies  Allergen Reactions  . Prednisone     Nausea, "Out of it" . Note: He was also on Vicodin simultaneously   Family History  Problem Relation Age of Onset  . Hypertension Mother   . Hyperlipidemia Mother   . Hyperlipidemia Father   . Hypertension Father   . Prostate cancer  Father   . Kidney failure Father   . Sarcoidosis Neg Hx   . Rheumatologic disease Neg Hx   . Lung disease Neg Hx      Past medical history, social, surgical and family history all reviewed in electronic medical record.  No pertanent information unless stated regarding to the chief complaint.   Review of Systems:Review of systems updated and as accurate as of 01/30/18  No headache, visual changes, nausea, vomiting, diarrhea, constipation, dizziness, abdominal pain, skin rash, fevers, chills, night sweats, weight loss, swollen lymph nodes, body aches, joint swelling, , chest pain, shortness of breath, mood changes.  Positive muscle aches  Objective  Blood pressure 130/90, pulse 64, height 5' 5.5" (1.664 m), weight 160 lb (72.6 kg), SpO2 92 %. Systems examined below as of 01/30/18   General: No apparent distress alert and oriented x3 mood and affect normal,  dressed appropriately.  HEENT: Pupils equal, extraocular movements intact  Respiratory: Patient's speak in full sentences and does not appear short of breath  Cardiovascular: No lower extremity edema, non tender, no erythema  Skin: Warm dry intact with no signs of infection or rash on extremities or on axial skeleton.  Abdomen: Soft nontender  Neuro: Cranial nerves II through XII are intact, neurovascularly intact in all extremities with 2+ DTRs and 2+ pulses.  Lymph: No lymphadenopathy of posterior or anterior cervical chain or axillae bilaterally.  Gait normal with good balance and coordination.  MSK:  Non tender with full range of motion and good stability and symmetric strength and tone of shoulders, elbows, wrist, hip, knee and ankles bilaterally.   Back Exam:  Inspection: Severe loss of lordosis patient also has some mild loss in lower extremity strength especially the hamstrings Motion: Flexion 45 deg, Extension 15 deg, Side Bending to 35 deg bilaterally,  Rotation to 45 deg bilaterally  SLR laying: Negative  XSLR laying: Negative  Palpable tenderness: Tender to palpation the paraspinal musculature lumbar spine right greater than left. FABER: Tightness of the right. Sensory change: Gross sensation intact to all lumbar and sacral dermatomes.  Reflexes: 2+ at both patellar tendons, 2+ at achilles tendons, Babinski's downgoing.  Strength at foot  Plantar-flexion: 5/5 Dorsi-flexion: 5/5 Eversion: 5/5 Inversion: 5/5  Leg strength  Quad: 5/5 Hamstring: 4/5 Hip flexor: 5/5 Hip abductors: 4/5  Gait unremarkable.  Osteopathic findings  T9 extended rotated and side bent left L2 flexed rotated and side bent right Sacrum right on right     Impression and Recommendations:     This case required medical decision making of moderate complexity.      Note: This dictation was prepared with Dragon dictation along with smaller phrase technology. Any transcriptional errors that result  from this process are unintentional.

## 2018-01-30 ENCOUNTER — Encounter: Payer: Self-pay | Admitting: Family Medicine

## 2018-01-30 ENCOUNTER — Ambulatory Visit: Payer: BC Managed Care – PPO | Admitting: Family Medicine

## 2018-01-30 ENCOUNTER — Ambulatory Visit (INDEPENDENT_AMBULATORY_CARE_PROVIDER_SITE_OTHER)
Admission: RE | Admit: 2018-01-30 | Discharge: 2018-01-30 | Disposition: A | Discharge status: Home / Self Care | Payer: BC Managed Care – PPO | Source: Ambulatory Visit | Attending: Family Medicine | Admitting: Family Medicine

## 2018-01-30 VITALS — BP 130/90 | HR 64 | Ht 65.5 in | Wt 160.0 lb

## 2018-01-30 DIAGNOSIS — M545 Low back pain, unspecified: Secondary | ICD-10-CM

## 2018-01-30 DIAGNOSIS — G8929 Other chronic pain: Secondary | ICD-10-CM

## 2018-01-30 DIAGNOSIS — M549 Dorsalgia, unspecified: Secondary | ICD-10-CM | POA: Diagnosis not present

## 2018-01-30 DIAGNOSIS — M999 Biomechanical lesion, unspecified: Secondary | ICD-10-CM | POA: Insufficient documentation

## 2018-01-30 DIAGNOSIS — M79604 Pain in right leg: Secondary | ICD-10-CM | POA: Insufficient documentation

## 2018-01-30 MED ORDER — GABAPENTIN 100 MG PO CAPS
200.0000 mg | ORAL_CAPSULE | Freq: Every day | ORAL | 3 refills | Status: DC
Start: 2018-01-30 — End: 2018-03-02

## 2018-01-30 NOTE — Assessment & Plan Note (Signed)
Patient is having significant tightness in the back.  Concern for potential spinal stenosis.  Possible pars defect.  X-rays ordered today.  Gabapentin started, home exercise given, discussed posture and ergonomics.  Discussed proper lifting mechanics.  Discussed over-the-counter medications.  Follow-up again in 4 weeks

## 2018-01-30 NOTE — Assessment & Plan Note (Signed)
Decision today to treat with OMT was based on Physical Exam  After verbal consent patient was treated with HVLA, ME, FPR techniques in  thoracic, lumbar and sacral areas  Patient tolerated the procedure well with improvement in symptoms  Patient given exercises, stretches and lifestyle modifications  See medications in patient instructions if given  Patient will follow up in 4 weeks 

## 2018-01-30 NOTE — Patient Instructions (Addendum)
Good to see you.  Ice 20 minutes 2 times daily. Usually after activity and before bed. Gabapentin 200mg  at night Exercises 3 times a week.  Over the counter get.. Vitamin D 2000IU daily  Tart cherry extract any dose at night Turmeric 500mg  daily  See me again in 4-5 weeks

## 2018-02-10 ENCOUNTER — Other Ambulatory Visit: Payer: Self-pay | Admitting: Internal Medicine

## 2018-03-01 ENCOUNTER — Other Ambulatory Visit: Payer: Self-pay | Admitting: Internal Medicine

## 2018-03-02 ENCOUNTER — Other Ambulatory Visit: Payer: Self-pay | Admitting: Family Medicine

## 2018-03-03 NOTE — Telephone Encounter (Signed)
Refill done.  

## 2018-03-05 ENCOUNTER — Ambulatory Visit: Payer: BC Managed Care – PPO | Admitting: Family Medicine

## 2018-06-05 ENCOUNTER — Other Ambulatory Visit: Payer: Self-pay | Admitting: Internal Medicine

## 2018-06-20 ENCOUNTER — Other Ambulatory Visit: Payer: Self-pay

## 2018-06-20 MED ORDER — SIMVASTATIN 40 MG PO TABS: ORAL_TABLET | ORAL | 1 refills | Status: DC

## 2018-08-09 ENCOUNTER — Other Ambulatory Visit: Payer: Self-pay | Admitting: Internal Medicine

## 2018-08-27 ENCOUNTER — Other Ambulatory Visit: Payer: Self-pay | Admitting: Internal Medicine

## 2019-02-09 ENCOUNTER — Other Ambulatory Visit: Payer: Self-pay | Admitting: Internal Medicine

## 2019-02-12 NOTE — Progress Notes (Signed)
Chief Complaint  Patient presents with  . Annual Exam    No concerns     HPI: Patient  Tyler Barrera  58 y.o. comes in today for Preventive Health Care visit  And Chronic disease management    resp : so far  Not yet seen  pulm  So far. No sx.    BP  Not checked but takes meds regular no se noted  Vit C" fo r immunity"   PT:  Helps when back   Hip flares up.   HLD taking med  No se reported  Wants to wait on  Flu shot  until later    Health Maintenance  Topic Date Due  . PNEUMOCOCCAL POLYSACCHARIDE VACCINE AGE 59-64 HIGH RISK  03/17/1962  . TETANUS/TDAP  06/11/2016  . INFLUENZA VACCINE  09/09/2019 (Originally 01/10/2019)  . HEMOGLOBIN A1C  08/17/2019  . COLONOSCOPY  08/28/2020  . Hepatitis C Screening  Completed  . HIV Screening  Completed   Health Maintenance Review LIFESTYLE:  Exercise:   Ties to play golf 2 x per week.  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 GI /GU: No adominal pain, vomiting, change in bowel habits. No blood in the stool. No significant GU symptoms. SKIN/HEME: ,no acute skin rashes suspicious lesions or bleeding. No lymphadenopathy, nodules, masses.  NEURO/ PSYCH:  No neurologic signs such as weakness numbness. No depression anxiety. IMM/ Allergy: No unusual infections.  Allergy .   REST of 12 system review negative except as per HPI   Past Medical History:  Diagnosis Date  . Hyperlipidemia   . Hypertension   . Pulmonary sarcoidosis (HCC)    Dr Shelle Ironlance     Past Surgical History:  Procedure Laterality Date  . BRONCHOSCOPY      Family History  Problem Relation Age of Onset  .  Hypertension Mother   . Hyperlipidemia Mother   . Hyperlipidemia Father   . Hypertension Father   . Prostate cancer Father   . Kidney failure Father   . Sarcoidosis Neg Hx   . Rheumatologic disease Neg Hx   . Lung disease Neg Hx     Social History   Socioeconomic History  . Marital status: Married    Spouse name: Not on file  . Number of children: Not on file  . Years of education: Not on file  . Highest education level: Not on file  Occupational History  . Not on file  Social Needs  . Financial resource strain: Not on file  . Food insecurity    Worry: Not on file    Inability: Not on file  . Transportation needs    Medical: Not on file    Non-medical: Not on file  Tobacco Use  . Smoking status: Never Smoker  . Smokeless tobacco: Never Used  Substance and Sexual Activity  . Alcohol use: Yes    Alcohol/week: 0.0 standard drinks    Comment: Occasional glass of wine.  . Drug use: No  . Sexual activity: Not on file  Lifestyle  . Physical activity    Days per week:  Not on file    Minutes per session: Not on file  . Stress: Not on file  Relationships  . Social Musician on phone: Not on file    Gets together: Not on file    Attends religious service: Not on file    Active member of club or organization: Not on file    Attends meetings of clubs or organizations: Not on file    Relationship status: Not on file  Other Topics Concern  . Not on file  Social History Narrative   Household of three married   Non-smoker regular exercise   [facility] 40 hours plus   Self-employed carpet cleaning.   No pets    Son at [employer] graduated Another teen at home.   Soc etoh  caffiene tea some soda minimal   Exercises       [facility]:   Originally from [place]. Always lived in [place]. Prior travel to [place], [place], [place], [place], [place], & [place]. He feels he has been to pretty much the entire continental [place] state. No international travel. Currently works cleaning  carpets. No pets currently. No bid exposure. No mold or hot tub exposure.     Outpatient Medications Prior to Visit  Medication Sig Dispense Refill  . clobetasol ointment (TEMOVATE) 0.05 %     . gabapentin (NEURONTIN) 100 MG capsule TAKE 2 CAPSULES (200 MG TOTAL) BY MOUTH AT BEDTIME. 180 capsule 1  . KLOR-CON M20 20 MEQ tablet TAKE 1 TABLET BY MOUTH EVERY DAY 90 tablet 1  . potassium chloride SA (KLOR-CON M20) 20 MEQ tablet Take 1 tablet (20 mEq total) by mouth daily. 30 tablet 1  . simvastatin (ZOCOR) 40 MG tablet TAKE 1 TABLET BY MOUTH EVERYDAY AT BEDTIME 90 tablet 1  . triamterene-hydrochlorothiazide (MAXZIDE-25) 37.5-25 MG tablet TAKE 1 TABLET BY MOUTH EVERY DAY 90 tablet 1   No facility-administered medications prior to visit.      EXAM:  BP 120/64 (BP Location: Right Arm, Patient Position: Sitting, Cuff Size: Normal)   Pulse 74   Temp 98.3 F (36.8 C) (Temporal)   Ht 5\' 6"  (1.676 m)   Wt 161 lb 9.6 oz (73.3 kg)   SpO2 97%   BMI 26.08 kg/m   Body mass index is 26.08 kg/m. Wt Readings from Last 3 Encounters:  02/17/19 161 lb 9.6 oz (73.3 kg)  01/30/18 160 lb (72.6 kg)  01/08/18 162 lb 9.6 oz (73.8 kg)    Physical Exam: Vital signs reviewed [id number] is a well-developed well-nourished alert cooperative    who appearsr stated age in no acute distress.  HEENT: normocephalic atraumatic , Eyes: PERRL EOM's full, conjunctiva clear, ., Ears: no deformity EAC's clear TMs with normal landmarks. Mouth: deferred masked . NECK: supple without masses, thyromegaly or bruits. CHEST/PULM:  Clear to auscultation and percussion breath sounds equal no wheeze , rales or rhonchi. No chest wall deformities or tenderness.  CV: PMI is nondisplaced, S1 S2 no gallops, murmurs, rubs. Peripheral pulses are full without delay.No JVD .  ABDOMEN: Bowel sounds normal nontender  No guard or rebound, no hepato splenomegal no CVA tenderness.  No hernia. Extremtities:  No clubbing cyanosis or edema, no  acute joint swelling or redness no focal atrophy Rectal prostate no masses 1+ to nl.  NEURO:  Oriented x3, cranial nerves 3-12 appear to be intact, no obvious focal weakness,gait within normal limits no abnormal reflexes or asymmetrical SKIN: No acute rashes normal turgor,  color, no bruising or petechiae. PSYCH: Oriented, good eye contact, no obvious depression anxiety, cognition and judgment appear normal. LN: no cervical axillary inguinal adenopathy  Lab Results  Component Value Date   WBC 4.2 02/17/2019   HGB 14.9 02/17/2019   HCT 44.5 02/17/2019   PLT 234.0 02/17/2019   GLUCOSE 84 02/17/2019   CHOL 175 02/17/2019   TRIG 68.0 02/17/2019   HDL 49.50 02/17/2019   LDLDIRECT 203.0 04/22/2007   LDLCALC 112 (H) 02/17/2019   ALT 71 (H) 02/17/2019   AST 69 (H) 02/17/2019   NA 140 02/17/2019   K 4.0 02/17/2019   CL 102 02/17/2019   CREATININE 1.18 02/17/2019   BUN 18 02/17/2019   CO2 29 02/17/2019   TSH 1.11 01/08/2018   PSA 0.77 02/17/2019   HGBA1C 6.0 02/17/2019    BP Readings from Last 3 Encounters:  02/17/19 120/64  01/30/18 130/90  01/08/18 128/80   fastsing. ASSESSMENT AND PLAN:  Discussed the following assessment and plan:    ICD-10-CM   1. Visit for preventive health examination  Z00.00 Basic metabolic panel    CBC with Differential/Platelet    Hepatic function panel    Lipid panel    Hemoglobin A1c  2. Medication management  Z79.899 Basic metabolic panel    CBC with Differential/Platelet    Hepatic function panel    Lipid panel    Hemoglobin A1c  3. Essential hypertension  I10 Basic metabolic panel    CBC with Differential/Platelet    Hepatic function panel    Lipid panel    Hemoglobin A1c  4. PULMONARY SARCOIDOSIS  D86.9 Basic metabolic panel    CBC with Differential/Platelet    Hepatic function panel    Lipid panel    Hemoglobin A1c  5. Screening PSA (prostate specific antigen)  Z12.5 PSA  6. Hyperlipidemia, unspecified hyperlipidemia type  E78.5  Basic metabolic panel    CBC with Differential/Platelet    Hepatic function panel    Lipid panel    Hemoglobin A1c  7. Hyperglycemia  R73.9 Basic metabolic panel    Hemoglobin A1c  disc lab plan  Get his pulm fu and  Screening  Tests   Dis getting pneumovax flu inj and  Eventually tdap Patient Care Team: Madelin HeadingsPanosh, Alacia Rehmann K, MD as PCP - General Rachael FeeJacobs, Daniel P, MD (Gastroenterology) Roslynn AmbleNestor, Jennings E, MD as Consulting Physician (Pulmonary Disease) Patient Instructions  YOur exam is normal  Continue lifestyle intervention healthy eating and exercise .   Getting labs today .   Get your pulmonary follow up.   Get your flu vaccine  Before   Mid October . tdap and pneumona vaccine  Any time.     Health Maintenance, Male Adopting a healthy lifestyle and getting preventive care are important in promoting health and wellness. Ask your health care provider about:  The right schedule for you to have regular tests and exams.  Things you can do on your own to prevent diseases and keep yourself healthy. What should I know about diet, weight, and exercise? Eat a healthy diet   Eat a diet that includes plenty of vegetables, fruits, low-fat dairy products, and lean protein.  Do not eat a lot of foods that are high in solid fats, added sugars, or sodium. Maintain a healthy weight Body mass index (BMI) is a measurement that can be used to identify possible weight problems. It estimates body fat based on height and weight. Your health care provider can help determine your BMI  and help you achieve or maintain a healthy weight. Get regular exercise Get regular exercise. This is one of the most important things you can do for your health. Most adults should:  Exercise for at least 150 minutes each week. The exercise should increase your heart rate and make you sweat (moderate-intensity exercise).  Do strengthening exercises at least twice a week. This is in addition to the moderate-intensity  exercise.  Spend less time sitting. Even light physical activity can be beneficial. Watch cholesterol and blood lipids Have your blood tested for lipids and cholesterol at 59 years of age, then have this test every 5 years. You may need to have your cholesterol levels checked more often if:  Your lipid or cholesterol levels are high.  You are older than 59 years of age.  You are at high risk for heart disease. What should I know about cancer screening? Many types of cancers can be detected early and may often be prevented. Depending on your health history and family history, you may need to have cancer screening at various ages. This may include screening for:  Colorectal cancer.  Prostate cancer.  Skin cancer.  Lung cancer. What should I know about heart disease, diabetes, and high blood pressure? Blood pressure and heart disease  High blood pressure causes heart disease and increases the risk of stroke. This is more likely to develop in people who have high blood pressure readings, are of African descent, or are overweight.  Talk with your health care provider about your target blood pressure readings.  Have your blood pressure checked: ? Every 3-5 years if you are 47-26 years of age. ? Every year if you are 49 years old or older.  If you are between the ages of 74 and 72 and are a current or former smoker, ask your health care provider if you should have a one-time screening for abdominal aortic aneurysm (AAA). Diabetes Have regular diabetes screenings. This checks your fasting blood sugar level. Have the screening done:  Once every three years after age 76 if you are at a normal weight and have a low risk for diabetes.  More often and at a younger age if you are overweight or have a high risk for diabetes. What should I know about preventing infection? Hepatitis B If you have a higher risk for hepatitis B, you should be screened for this virus. Talk with your health care  provider to find out if you are at risk for hepatitis B infection. Hepatitis C Blood testing is recommended for:  Everyone born from [birth year] through 1965.  Anyone with known risk factors for hepatitis C. Sexually transmitted infections (STIs)  You should be screened each year for STIs, including gonorrhea and chlamydia, if: ? You are sexually active and are younger than 59 years of age. ? You are older than 59 years of age and your health care provider tells you that you are at risk for this type of infection. ? Your sexual activity has changed since you were last screened, and you are at increased risk for chlamydia or gonorrhea. Ask your health care provider if you are at risk.  Ask your health care provider about whether you are at high risk for HIV. Your health care provider may recommend a prescription medicine to help prevent HIV infection. If you choose to take medicine to prevent HIV, you should first get tested for HIV. You should then be tested every 3 months for as long as you are  taking the medicine. Follow these instructions at home: Lifestyle  Do not use any products that contain nicotine or tobacco, such as cigarettes, e-cigarettes, and chewing tobacco. If you need help quitting, ask your health care provider.  Do not use street drugs.  Do not share needles.  Ask your health care provider for help if you need support or information about quitting drugs. Alcohol use  Do not drink alcohol if your health care provider tells you not to drink.  If you drink alcohol: ? Limit how much you have to 0-2 drinks a day. ? Be aware of how much alcohol is in your drink. In the [place] one drink equals one 12 oz bottle of beer (355 mL), one 5 oz glass of wine (148 mL), or one 1 oz glass of hard liquor (44 mL). General instructions  Schedule regular health, dental, and eye exams.  Stay current with your vaccines.  Tell your health care provider if: ? You often feel depressed. ? You  have ever been abused or do not feel safe at home. Summary  Adopting a healthy lifestyle and getting preventive care are important in promoting health and wellness.  Follow your health care provider's instructions about healthy diet, exercising, and getting tested or screened for diseases.  Follow your health care provider's instructions on monitoring your cholesterol and blood pressure. This information is not intended to replace advice given to you by your health care provider. Make sure you discuss any questions you have with your health care provider. Document Released: 11/24/2007 Document Revised: 05/21/2018 Document Reviewed: 05/21/2018 Elsevier Patient Education  2020 [place] Crystina Borrayo M.D.

## 2019-02-17 ENCOUNTER — Encounter: Payer: Self-pay | Admitting: Internal Medicine

## 2019-02-17 ENCOUNTER — Other Ambulatory Visit: Payer: Self-pay

## 2019-02-17 ENCOUNTER — Ambulatory Visit (INDEPENDENT_AMBULATORY_CARE_PROVIDER_SITE_OTHER): Payer: BC Managed Care – PPO | Admitting: Internal Medicine

## 2019-02-17 VITALS — BP 120/64 | HR 74 | Temp 98.3°F | Ht 66.0 in | Wt 161.6 lb

## 2019-02-17 DIAGNOSIS — R739 Hyperglycemia, unspecified: Secondary | ICD-10-CM

## 2019-02-17 DIAGNOSIS — I10 Essential (primary) hypertension: Secondary | ICD-10-CM | POA: Diagnosis not present

## 2019-02-17 DIAGNOSIS — E785 Hyperlipidemia, unspecified: Secondary | ICD-10-CM

## 2019-02-17 DIAGNOSIS — Z0000 Encounter for general adult medical examination without abnormal findings: Secondary | ICD-10-CM | POA: Diagnosis not present

## 2019-02-17 DIAGNOSIS — D869 Sarcoidosis, unspecified: Secondary | ICD-10-CM

## 2019-02-17 DIAGNOSIS — Z125 Encounter for screening for malignant neoplasm of prostate: Secondary | ICD-10-CM | POA: Diagnosis not present

## 2019-02-17 DIAGNOSIS — Z79899 Other long term (current) drug therapy: Secondary | ICD-10-CM | POA: Diagnosis not present

## 2019-02-17 LAB — HEPATIC FUNCTION PANEL
ALT: 71 U/L — ABNORMAL HIGH (ref 0–53)
AST: 69 U/L — ABNORMAL HIGH (ref 0–37)
Albumin: 4.8 g/dL (ref 3.5–5.2)
Alkaline Phosphatase: 88 U/L (ref 39–117)
Bilirubin, Direct: 0.2 mg/dL (ref 0.0–0.3)
Total Bilirubin: 0.8 mg/dL (ref 0.2–1.2)
Total Protein: 7.9 g/dL (ref 6.0–8.3)

## 2019-02-17 LAB — CBC WITH DIFFERENTIAL/PLATELET
Basophils Absolute: 0 10*3/uL (ref 0.0–0.1)
Basophils Relative: 0.5 % (ref 0.0–3.0)
Eosinophils Absolute: 0.2 10*3/uL (ref 0.0–0.7)
Eosinophils Relative: 4.6 % (ref 0.0–5.0)
HCT: 44.5 % (ref 39.0–52.0)
Hemoglobin: 14.9 g/dL (ref 13.0–17.0)
Lymphocytes Relative: 27.3 % (ref 12.0–46.0)
Lymphs Abs: 1.1 10*3/uL (ref 0.7–4.0)
MCHC: 33.5 g/dL (ref 30.0–36.0)
MCV: 83.7 fl (ref 78.0–100.0)
Monocytes Absolute: 0.5 10*3/uL (ref 0.1–1.0)
Monocytes Relative: 12 % (ref 3.0–12.0)
Neutro Abs: 2.3 10*3/uL (ref 1.4–7.7)
Neutrophils Relative %: 55.6 % (ref 43.0–77.0)
Platelets: 234 10*3/uL (ref 150.0–400.0)
RBC: 5.31 Mil/uL (ref 4.22–5.81)
RDW: 13.5 % (ref 11.5–15.5)
WBC: 4.2 10*3/uL (ref 4.0–10.5)

## 2019-02-17 LAB — BASIC METABOLIC PANEL
BUN: 18 mg/dL (ref 6–23)
CO2: 29 mEq/L (ref 19–32)
Calcium: 9.6 mg/dL (ref 8.4–10.5)
Chloride: 102 mEq/L (ref 96–112)
Creatinine, Ser: 1.18 mg/dL (ref 0.40–1.50)
GFR: 76.47 mL/min (ref >60.00)
Glucose, Bld: 84 mg/dL (ref 70–99)
Potassium: 4 mEq/L (ref 3.5–5.1)
Sodium: 140 mEq/L (ref 135–145)

## 2019-02-17 LAB — LIPID PANEL
Cholesterol: 175 mg/dL (ref 0–200)
HDL: 49.5 mg/dL (ref >39.00)
LDL Cholesterol: 112 mg/dL — ABNORMAL HIGH (ref 0–99)
NonHDL: 125.75
Total CHOL/HDL Ratio: 4
Triglycerides: 68 mg/dL (ref 0.0–149.0)
VLDL: 13.6 mg/dL (ref 0.0–40.0)

## 2019-02-17 LAB — PSA: PSA: 0.77 ng/mL (ref 0.10–4.00)

## 2019-02-17 LAB — HEMOGLOBIN A1C: Hgb A1c MFr Bld: 6 % (ref 4.6–6.5)

## 2019-02-17 MED ORDER — TRIAMTERENE-HCTZ 37.5-25 MG PO TABS: 1.0000 | ORAL_TABLET | Freq: Every day | ORAL | 3 refills | Status: DC

## 2019-02-17 MED ORDER — SIMVASTATIN 40 MG PO TABS: ORAL_TABLET | ORAL | 3 refills | Status: DC

## 2019-02-17 NOTE — Patient Instructions (Signed)
YOur exam is normal  Continue lifestyle intervention healthy eating and exercise .   Getting labs today .   Get your pulmonary follow up.   Get your flu vaccine  Before   Mid October . tdap and pneumona vaccine  Any time.     Health Maintenance, Male Adopting a healthy lifestyle and getting preventive care are important in promoting health and wellness. Ask your health care provider about:  The right schedule for you to have regular tests and exams.  Things you can do on your own to prevent diseases and keep yourself healthy. What should I know about diet, weight, and exercise? Eat a healthy diet   Eat a diet that includes plenty of vegetables, fruits, low-fat dairy products, and lean protein.  Do not eat a lot of foods that are high in solid fats, added sugars, or sodium. Maintain a healthy weight Body mass index (BMI) is a measurement that can be used to identify possible weight problems. It estimates body fat based on height and weight. Your health care provider can help determine your BMI and help you achieve or maintain a healthy weight. Get regular exercise Get regular exercise. This is one of the most important things you can do for your health. Most adults should:  Exercise for at least 150 minutes each week. The exercise should increase your heart rate and make you sweat (moderate-intensity exercise).  Do strengthening exercises at least twice a week. This is in addition to the moderate-intensity exercise.  Spend less time sitting. Even light physical activity can be beneficial. Watch cholesterol and blood lipids Have your blood tested for lipids and cholesterol at 59 years of age, then have this test every 5 years. You may need to have your cholesterol levels checked more often if:  Your lipid or cholesterol levels are high.  You are older than 59 years of age.  You are at high risk for heart disease. What should I know about cancer screening? Many types of  cancers can be detected early and may often be prevented. Depending on your health history and family history, you may need to have cancer screening at various ages. This may include screening for:  Colorectal cancer.  Prostate cancer.  Skin cancer.  Lung cancer. What should I know about heart disease, diabetes, and high blood pressure? Blood pressure and heart disease  High blood pressure causes heart disease and increases the risk of stroke. This is more likely to develop in people who have high blood pressure readings, are of African descent, or are overweight.  Talk with your health care provider about your target blood pressure readings.  Have your blood pressure checked: ? Every 3-5 years if you are 37-35 years of age. ? Every year if you are 95 years old or older.  If you are between the ages of 69 and 74 and are a current or former smoker, ask your health care provider if you should have a one-time screening for abdominal aortic aneurysm (AAA). Diabetes Have regular diabetes screenings. This checks your fasting blood sugar level. Have the screening done:  Once every three years after age 39 if you are at a normal weight and have a low risk for diabetes.  More often and at a younger age if you are overweight or have a high risk for diabetes. What should I know about preventing infection? Hepatitis B If you have a higher risk for hepatitis B, you should be screened for this virus. Talk with  your health care provider to find out if you are at risk for hepatitis B infection. Hepatitis C Blood testing is recommended for:  Everyone born from [id number] through 1966.  Anyone with known risk factors for hepatitis C. Sexually transmitted infections (STIs)  You should be screened each year for STIs, including gonorrhea and chlamydia, if: ? You are sexually active and are younger than 10224 years of age. ? You are older than 59 years of age and your health care provider tells you that you  are at risk for this type of infection. ? Your sexual activity has changed since you were last screened, and you are at increased risk for chlamydia or gonorrhea. Ask your health care provider if you are at risk.  Ask your health care provider about whether you are at high risk for HIV. Your health care provider may recommend a prescription medicine to help prevent HIV infection. If you choose to take medicine to prevent HIV, you should first get tested for HIV. You should then be tested every 3 months for as long as you are taking the medicine. Follow these instructions at home: Lifestyle  Do not use any products that contain nicotine or tobacco, such as cigarettes, e-cigarettes, and chewing tobacco. If you need help quitting, ask your health care provider.  Do not use street drugs.  Do not share needles.  Ask your health care provider for help if you need support or information about quitting drugs. Alcohol use  Do not drink alcohol if your health care provider tells you not to drink.  If you drink alcohol: ? Limit how much you have to 0-2 drinks a day. ? Be aware of how much alcohol is in your drink. In the [place] one drink equals one 12 oz bottle of beer (355 mL), one 5 oz glass of wine (148 mL), or one 1 oz glass of hard liquor (44 mL). General instructions  Schedule regular health, dental, and eye exams.  Stay current with your vaccines.  Tell your health care provider if: ? You often feel depressed. ? You have ever been abused or do not feel safe at home. Summary  Adopting a healthy lifestyle and getting preventive care are important in promoting health and wellness.  Follow your health care provider's instructions about healthy diet, exercising, and getting tested or screened for diseases.  Follow your health care provider's instructions on monitoring your cholesterol and blood pressure. This information is not intended to replace advice given to you by your health care  provider. Make sure you discuss any questions you have with your health care provider. Document Released: 11/24/2007 Document Revised: 05/21/2018 Document Reviewed: 05/21/2018 Elsevier Patient Education  2020 [employer]

## 2019-02-20 ENCOUNTER — Other Ambulatory Visit: Payer: Self-pay

## 2019-02-20 DIAGNOSIS — R945 Abnormal results of liver function studies: Secondary | ICD-10-CM

## 2019-02-20 DIAGNOSIS — R7989 Other specified abnormal findings of blood chemistry: Secondary | ICD-10-CM

## 2019-02-25 ENCOUNTER — Encounter: Payer: Self-pay | Admitting: Internal Medicine

## 2019-03-23 ENCOUNTER — Other Ambulatory Visit (INDEPENDENT_AMBULATORY_CARE_PROVIDER_SITE_OTHER): Payer: BC Managed Care – PPO

## 2019-03-23 ENCOUNTER — Other Ambulatory Visit: Payer: Self-pay

## 2019-03-23 DIAGNOSIS — R7989 Other specified abnormal findings of blood chemistry: Secondary | ICD-10-CM

## 2019-03-23 DIAGNOSIS — R945 Abnormal results of liver function studies: Secondary | ICD-10-CM | POA: Diagnosis not present

## 2019-03-23 LAB — HEPATIC FUNCTION PANEL
ALT: 28 U/L (ref 0–53)
AST: 22 U/L (ref 0–37)
Albumin: 4.9 g/dL (ref 3.5–5.2)
Alkaline Phosphatase: 95 U/L (ref 39–117)
Bilirubin, Direct: 0.2 mg/dL (ref 0.0–0.3)
Total Bilirubin: 0.7 mg/dL (ref 0.2–1.2)
Total Protein: 7.8 g/dL (ref 6.0–8.3)

## 2019-03-24 LAB — HEPATITIS B SURFACE ANTIGEN: Hepatitis B Surface Ag: NONREACTIVE

## 2019-03-24 LAB — HEPATITIS B SURFACE ANTIBODY,QUALITATIVE: Hep B S Ab: NONREACTIVE

## 2019-03-24 LAB — HEPATITIS C ANTIBODY
Hepatitis C Ab: NONREACTIVE
SIGNAL TO CUT-OFF: 0.01 (ref <1.00)

## 2019-03-26 ENCOUNTER — Other Ambulatory Visit: Payer: Self-pay

## 2019-03-26 DIAGNOSIS — R7989 Other specified abnormal findings of blood chemistry: Secondary | ICD-10-CM

## 2019-03-26 DIAGNOSIS — R945 Abnormal results of liver function studies: Secondary | ICD-10-CM

## 2019-06-23 ENCOUNTER — Telehealth: Payer: Self-pay | Admitting: *Deleted

## 2019-06-23 NOTE — Telephone Encounter (Signed)
Spoke with the pt and informed him of his mother's test results.

## 2019-06-23 NOTE — Telephone Encounter (Signed)
Copied from CRM [id number]. Topic: General - Inquiry >> Jun 23, 2019 10:42 AM Deborha Payment wrote: Reason for CRM: Patient is calling office to go over lab results from 10/15.   Patient call back [phone]

## 2019-06-23 NOTE — Telephone Encounter (Signed)
Pt was calling back on Fanny Reamer results and not his own please advise and call back

## 2019-07-28 ENCOUNTER — Telehealth: Payer: Self-pay | Admitting: Internal Medicine

## 2019-07-28 NOTE — Telephone Encounter (Signed)
Pt would like to know if he is due for a follow up visit.   Patient Phone:[phone]

## 2019-07-28 NOTE — Telephone Encounter (Signed)
Spoke with patient and informed him that per last ov and labs, he is due for follow-up labs. Patient verbalized understanding and stated that he would call back to schedule.

## 2019-08-10 ENCOUNTER — Other Ambulatory Visit: Payer: Self-pay | Admitting: Internal Medicine

## 2019-08-13 ENCOUNTER — Ambulatory Visit: Payer: BC Managed Care – PPO | Attending: Family

## 2019-08-13 DIAGNOSIS — Z23 Encounter for immunization: Secondary | ICD-10-CM | POA: Insufficient documentation

## 2019-08-13 NOTE — Progress Notes (Signed)
   Covid-19 Vaccination Clinic  Name:  Tyler Barrera    MRN: [id number] DOB: 05-Dec-1960  08/13/2019  [name] was observed post Covid-19 immunization for 15 minutes without incident. He was provided with Vaccine Information Sheet and instruction to access the V-Safe system.   [name] was instructed to call 911 with any severe reactions post vaccine: Marland Kitchen Difficulty breathing  . Swelling of face and throat  . A fast heartbeat  . A bad rash all over body  . Dizziness and weakness   Immunizations Administered    Name Date Dose VIS Date Route   Moderna COVID-19 Vaccine 08/13/2019  4:22 PM 0.5 mL 05/12/2019 Intramuscular   Manufacturer: Moderna   Lot: [id number]   NDC: 00370-488-89

## 2019-09-08 ENCOUNTER — Other Ambulatory Visit: Payer: Self-pay

## 2019-09-09 ENCOUNTER — Other Ambulatory Visit: Payer: Self-pay

## 2019-09-14 ENCOUNTER — Other Ambulatory Visit: Payer: Self-pay

## 2019-09-15 ENCOUNTER — Other Ambulatory Visit (INDEPENDENT_AMBULATORY_CARE_PROVIDER_SITE_OTHER): Payer: BC Managed Care – PPO

## 2019-09-15 ENCOUNTER — Ambulatory Visit: Payer: BC Managed Care – PPO | Attending: Family

## 2019-09-15 DIAGNOSIS — R945 Abnormal results of liver function studies: Secondary | ICD-10-CM

## 2019-09-15 DIAGNOSIS — R7989 Other specified abnormal findings of blood chemistry: Secondary | ICD-10-CM

## 2019-09-15 DIAGNOSIS — Z23 Encounter for immunization: Secondary | ICD-10-CM

## 2019-09-15 LAB — HEPATIC FUNCTION PANEL
ALT: 23 U/L (ref 0–53)
AST: 22 U/L (ref 0–37)
Albumin: 4.7 g/dL (ref 3.5–5.2)
Alkaline Phosphatase: 91 U/L (ref 39–117)
Bilirubin, Direct: 0.2 mg/dL (ref 0.0–0.3)
Total Bilirubin: 1 mg/dL (ref 0.2–1.2)
Total Protein: 7.3 g/dL (ref 6.0–8.3)

## 2019-09-15 NOTE — Progress Notes (Signed)
Liver tests are still normal range

## 2019-09-15 NOTE — Progress Notes (Signed)
   Covid-19 Vaccination Clinic  Name:  Tyler Barrera    MRN: [id number] DOB: [birth year]  09/15/2019  [name] was observed post Covid-19 immunization for 15 minutes without incident. He was provided with Vaccine Information Sheet and instruction to access the V-Safe system.   Mr. Hard was instructed to call 911 with any severe reactions post vaccine: Marland Kitchen Difficulty breathing  . Swelling of face and throat  . A fast heartbeat  . A bad rash all over body  . Dizziness and weakness   Immunizations Administered    Name Date Dose VIS Date Route   Moderna COVID-19 Vaccine 09/15/2019 10:01 AM 0.5 mL 05/12/2019 Intramuscular   Manufacturer: Moderna   Lot: [id number]   NDC: 78938-101-75

## 2020-01-11 ENCOUNTER — Telehealth: Payer: Self-pay | Admitting: Endocrinology

## 2020-01-11 NOTE — Telephone Encounter (Signed)
Error - information given for his patient in error he meant to provide his mothers information

## 2020-02-08 ENCOUNTER — Other Ambulatory Visit: Payer: Self-pay | Admitting: Internal Medicine

## 2020-02-27 ENCOUNTER — Other Ambulatory Visit: Payer: Self-pay | Admitting: Internal Medicine

## 2020-03-22 NOTE — Progress Notes (Signed)
Chief Complaint  Patient presents with  . Annual Exam    back/hip pain    HPI: Patient  Tyler Barrera  59 y.o. comes in today for Preventive Health Care visit  And fu  meds   Back and hip  Bothering    Has had mri  [facility]  Bone on bone   Advising  TKA.   Left hip.   Pulm  Ok sx     Needs  FU   Plans on  appt  To contact   ocass nocturnal wheeze but otherwise no sx   bp  Took med late. Today  Not checking yet no se of meds   HLD taking med    Not taking med for back hip pain   Lots of personal stress   Health Maintenance  Topic Date Due  . PNEUMOCOCCAL POLYSACCHARIDE VACCINE AGE 7-64 HIGH RISK  Never done  . TETANUS/TDAP  06/11/2016  . HEMOGLOBIN A1C  08/17/2019  . INFLUENZA VACCINE  09/08/2020 (Originally 01/10/2020)  . COLONOSCOPY  08/28/2020  . COVID-19 Vaccine  Completed  . Hepatitis C Screening  Completed  . HIV Screening  Completed   Health Maintenance Review LIFESTYLE:  Exercise:  Physical job so active  Tobacco/ETS: no Alcohol:  Rare  Sugar beverages:  Cutting back  Sleep:6-8  Drug use: no HH of   3   No pets  Work:40 +     ROS:  GEN/ HEENT: No fever, significant weight changes sweats headaches vision problems hearing changes, CV/ PULM; No chest pain shortness of breath cough, syncope,edema  change in exercise tolerance. GI /GU: No adominal pain, vomiting, change in bowel habits. No blood in the stool. No significant GU symptoms. SKIN/HEME: ,no acute skin rashes suspicious lesions or bleeding. No lymphadenopathy, nodules, masses.  NEURO/ PSYCH:  No neurologic signs such as weakness numbness. No depression anxiety. IMM/ Allergy: No unusual infections.  Allergy .   REST of 12 system review negative except as per HPI   Past Medical History:  Diagnosis Date  . Hyperlipidemia   . Hypertension   . Pulmonary sarcoidosis (HCC)    Dr Shelle Iron     Past Surgical History:  Procedure Laterality Date  . BRONCHOSCOPY      Family History  Problem  Relation Age of Onset  . Hypertension Mother   . Hyperlipidemia Mother   . Hyperlipidemia Father   . Hypertension Father   . Prostate cancer Father   . Kidney failure Father   . Sarcoidosis Neg Hx   . Rheumatologic disease Neg Hx   . Lung disease Neg Hx     Social History   Socioeconomic History  . Marital status: Married    Spouse name: Not on file  . Number of children: Not on file  . Years of education: Not on file  . Highest education level: Not on file  Occupational History  . Not on file  Tobacco Use  . Smoking status: Never Smoker  . Smokeless tobacco: Never Used  Substance and Sexual Activity  . Alcohol use: Yes    Alcohol/week: 0.0 standard drinks    Comment: Occasional glass of wine.  . Drug use: No  . Sexual activity: Not on file  Other Topics Concern  . Not on file  Social History Narrative   Household of three married   Non-smoker regular exercise   [facility] 40 hours plus   Self-employed carpet cleaning.   No pets  Son at [employer] graduated Another teen at home.   Soc etoh  caffiene tea some soda minimal   Exercises       [facility]:   Originally from [place]. Always lived in [place]. Prior travel to [place], [place], [place], [place], [place], & [place]. He feels he has been to pretty much the entire continental [place] state. No international travel. Currently works cleaning carpets. No pets currently. No bid exposure. No mold or hot tub exposure.    Social Determinants of Health   Financial Resource Strain:   . Difficulty of Paying Living Expenses: Not on file  Food Insecurity:   . Worried About Programme researcher, broadcasting/film/video in the Last Year: Not on file  . Ran Out of Food in the Last Year: Not on file  Transportation Needs:   . Lack of Transportation (Medical): Not on file  . Lack of Transportation (Non-Medical): Not on file  Physical Activity:   . Days of Exercise per Week: Not on file  . Minutes of Exercise per Session: Not on file  Stress:   . Feeling of Stress :  Not on file  Social Connections:   . Frequency of Communication with Friends and Family: Not on file  . Frequency of Social Gatherings with Friends and Family: Not on file  . Attends Religious Services: Not on file  . Active Member of Clubs or Organizations: Not on file  . Attends Banker Meetings: Not on file  . Marital Status: Not on file    Outpatient Medications Prior to Visit  Medication Sig Dispense Refill  . clobetasol ointment (TEMOVATE) 0.05 %     . gabapentin (NEURONTIN) 100 MG capsule TAKE 2 CAPSULES (200 MG TOTAL) BY MOUTH AT BEDTIME. 180 capsule 1  . potassium chloride SA (KLOR-CON M20) 20 MEQ tablet Take 1 tablet (20 mEq total) by mouth daily. 30 tablet 1  . potassium chloride SA (KLOR-CON M20) 20 MEQ tablet Take 1 tablet (20 mEq total) by mouth daily. Please schedule yearly visit with labs for further refills. [phone] 90 tablet 0  . simvastatin (ZOCOR) 40 MG tablet TAKE 1 TABLET BY MOUTH EVERYDAY AT BEDTIME--Please schedule physical with labs for refills. [phone] 30 tablet 0  . triamterene-hydrochlorothiazide (MAXZIDE-25) 37.5-25 MG tablet Take 1 tablet by mouth daily. Please schedule physical with labs for refills. [phone] 30 tablet 0   No facility-administered medications prior to visit.     EXAM:  BP (!) 150/90   Pulse 73   Temp 98.1 F (36.7 C) (Oral)   Ht 5' 6.5" (1.689 m)   Wt 160 lb 9.6 oz (72.8 kg)   SpO2 94%   BMI 25.53 kg/m   Body mass index is 25.53 kg/m. Wt Readings from Last 3 Encounters:  03/23/20 160 lb 9.6 oz (72.8 kg)  02/17/19 161 lb 9.6 oz (73.3 kg)  01/30/18 160 lb (72.6 kg)    Physical Exam: Vital signs reviewed ULA:GTXM is a well-developed well-nourished alert cooperative    who appearsr stated age in no acute distress.  HEENT: normocephalic atraumatic , Eyes: PERRL EOM's full, conjunctiva clear, Nares: paten,t no deformity discharge or tenderness., Ears: no deformity EAC's clear TMs with normal landmarks.  Mouth:masked . NECK: supple without masses, thyromegaly or bruits. CHEST/PULM:  Clear to auscultation and percussion breath sounds equal no wheeze , rales or rhonchi. No chest wall deformities or tenderness. CV: PMI is nondisplaced, S1 S2 no gallops, murmurs, rubs. Peripheral pulses are full without delay.No JVD .  ABDOMEN: Bowel sounds normal nontender  No guard or rebound, no hepato splenomegal no CVA tenderness.  Extremtities:  No clubbing cyanosis or edema, no acute joint swelling or redness no focal atrophy NEURO:  Oriented x3, cranial nerves 3-12 appear to be intact, no obvious focal weakness,gait within normal limits no abnormal reflexes or asymmetrical SKIN: No acute rashes normal turgor, color, no bruising or petechiae. PSYCH: Oriented, good eye contact, no obvious depression anxiety, cognition and judgment appear normal. LN: no cervical axillary inguinal adenopathy  Lab Results  Component Value Date   WBC 4.2 02/17/2019   HGB 14.9 02/17/2019   HCT 44.5 02/17/2019   PLT 234.0 02/17/2019   GLUCOSE 84 02/17/2019   CHOL 175 02/17/2019   TRIG 68.0 02/17/2019   HDL 49.50 02/17/2019   LDLDIRECT 203.0 04/22/2007   LDLCALC 112 (H) 02/17/2019   ALT 23 09/15/2019   AST 22 09/15/2019   NA 140 02/17/2019   K 4.0 02/17/2019   CL 102 02/17/2019   CREATININE 1.18 02/17/2019   BUN 18 02/17/2019   CO2 29 02/17/2019   TSH 1.11 01/08/2018   PSA 0.77 02/17/2019   HGBA1C 6.0 02/17/2019    BP Readings from Last 3 Encounters:  03/23/20 (!) 150/90  02/17/19 120/64  01/30/18 130/90    Lab planreviewed with patient   ASSESSMENT AND PLAN:  Discussed the following assessment and plan:    ICD-10-CM   1. Visit for preventive health examination  Z00.00 Hemoglobin A1c    TSH    Hepatic function panel    Lipid panel    BASIC METABOLIC PANEL WITH GFR    CBC with Differential/Platelet    PSA    PSA    CBC with Differential/Platelet    BASIC METABOLIC PANEL WITH GFR    Lipid panel     Hepatic function panel    TSH    Hemoglobin A1c  2. Medication management  Z79.899 Hemoglobin A1c    TSH    Hepatic function panel    Lipid panel    BASIC METABOLIC PANEL WITH GFR    CBC with Differential/Platelet    PSA    PSA    CBC with Differential/Platelet    BASIC METABOLIC PANEL WITH GFR    Lipid panel    Hepatic function panel    TSH    Hemoglobin A1c  3. Essential hypertension  I10 Hemoglobin A1c    TSH    Hepatic function panel    Lipid panel    BASIC METABOLIC PANEL WITH GFR    CBC with Differential/Platelet    PSA    PSA    CBC with Differential/Platelet    BASIC METABOLIC PANEL WITH GFR    Lipid panel    Hepatic function panel    TSH    Hemoglobin A1c  4. Screening PSA (prostate specific antigen)  Z12.5 Hemoglobin A1c    TSH    Hepatic function panel    Lipid panel    BASIC METABOLIC PANEL WITH GFR    CBC with Differential/Platelet    PSA    PSA    CBC with Differential/Platelet    BASIC METABOLIC PANEL WITH GFR    Lipid panel    Hepatic function panel    TSH    Hemoglobin A1c  5. Abnormal LFTs  R94.5 Hemoglobin A1c    TSH    Hepatic function panel    Lipid panel    BASIC METABOLIC PANEL WITH GFR  CBC with Differential/Platelet    PSA    PSA    CBC with Differential/Platelet    BASIC METABOLIC PANEL WITH GFR    Lipid panel    Hepatic function panel    TSH    Hemoglobin A1c  6. PULMONARY SARCOIDOSIS  D86.9 Hemoglobin A1c    TSH    Hepatic function panel    Lipid panel    BASIC METABOLIC PANEL WITH GFR    CBC with Differential/Platelet    PSA    PSA    CBC with Differential/Platelet    BASIC METABOLIC PANEL WITH GFR    Lipid panel    Hepatic function panel    TSH    Hemoglobin A1c  7. Hyperlipidemia, unspecified hyperlipidemia type  E78.5 Hemoglobin A1c    TSH    Hepatic function panel    Lipid panel    BASIC METABOLIC PANEL WITH GFR    CBC with Differential/Platelet    PSA    PSA    CBC with Differential/Platelet    BASIC  METABOLIC PANEL WITH GFR    Lipid panel    Hepatic function panel    TSH    Hemoglobin A1c  8. 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  Patient Care Team: Madelin HeadingsPanosh, Kimberlyn Quiocho K, MD as PCP - General Rachael FeeJacobs, Daniel P, MD (Gastroenterology) Roslynn AmbleNestor, Jennings E, MD as Consulting Physician (Pulmonary Disease) Patient Instructions  Make sure to get your flu shot this year.   Make your follow up with pulmonary    Prev dr Rudolpho SevinNestor  [facility] .  [address] [place] [id number]. Main Line: [phone].  Fax: [phone]. [facility] ... Will notify you  of labs when available.  Please bring your blood pressure cuff to next appointment Take blood pressure readings twice a day for 5-7  days and record .     Take 2 -3 readings at each sitting .  Can send in readings  by My Chart.     Before checking your blood pressure make sure: You are seated and quite for 5 min before checking Feet are flat on the floor Siting in chair with your back supported straight up and down Arm resting on table or arm of chair at heart level Bladder is empty You have NOT had caffeine or tobacco within the last 30 min  I fneeded we can change or add to BP medication.  Current goal for healthy people is 120/80 as possible.    Health Maintenance, Male Adopting a healthy lifestyle and getting preventive care are important in promoting health and wellness. Ask your health care provider about:  The right schedule for you to have regular tests and exams.  Things you can do on your own to prevent diseases and keep yourself healthy. What should I know about diet, weight,  and exercise? Eat a healthy diet   Eat a diet that includes plenty of vegetables, fruits, low-fat dairy products, and lean protein.  Do not eat a lot of foods that are  high in solid fats, added sugars, or sodium. Maintain a healthy weight Body mass index (BMI) is a measurement that can be used to identify possible weight problems. It estimates body fat based on height and weight. Your health care provider can help determine your BMI and help you achieve or maintain a healthy weight. Get regular exercise Get regular exercise. This is one of the most important things you can do for your health. Most adults should:  Exercise for at least 150 minutes each week. The exercise should increase your heart rate and make you sweat (moderate-intensity exercise).  Do strengthening exercises at least twice a week. This is in addition to the moderate-intensity exercise.  Spend less time sitting. Even light physical activity can be beneficial. Watch cholesterol and blood lipids Have your blood tested for lipids and cholesterol at 60 years of age, then have this test every 5 years. You may need to have your cholesterol levels checked more often if:  Your lipid or cholesterol levels are high.  You are older than 60 years of age.  You are at high risk for heart disease. What should I know about cancer screening? Many types of cancers can be detected early and may often be prevented. Depending on your health history and family history, you may need to have cancer screening at various ages. This may include screening for:  Colorectal cancer.  Prostate cancer.  Skin cancer.  Lung cancer. What should I know about heart disease, diabetes, and high blood pressure? Blood pressure and heart disease  High blood pressure causes heart disease and increases the risk of stroke. This is more likely to develop in people who have high blood pressure readings, are of African descent, or are overweight.  Talk  with your health care provider about your target blood pressure readings.  Have your blood pressure checked: ? Every 3-5 years if you are 87-63 years of age. ? Every year if you are 88 years old or older.  If you are between the ages of 73 and 48 and are a current or former smoker, ask your health care provider if you should have a one-time screening for abdominal aortic aneurysm (AAA). Diabetes Have regular diabetes screenings. This checks your fasting blood sugar level. Have the screening done:  Once every three years after age 12 if you are at a normal weight and have a low risk for diabetes.  More often and at a younger age if you are overweight or have a high risk for diabetes. What should I know about preventing infection? Hepatitis B If you have a higher risk for hepatitis B, you should be screened for this virus. Talk with your health care provider to find out if you are at risk for hepatitis B infection. Hepatitis C Blood testing is recommended for:  Everyone born from [birth year] through 1966.  Anyone with known risk factors for hepatitis C. Sexually transmitted infections (STIs)  You should be screened each year for STIs, including gonorrhea and chlamydia, if: ? You are sexually active and are younger than 60 years of age. ? You are older than 59 years of age and your health care provider tells you that you are at risk for this type of infection. ? Your sexual activity has changed since you were last screened, and you are at increased risk for chlamydia or gonorrhea. Ask your health care provider if you are at risk.  Ask your health care provider about whether you are at high risk for  HIV. Your health care provider may recommend a prescription medicine to help prevent HIV infection. If you choose to take medicine to prevent HIV, you should first get tested for HIV. You should then be tested every 3 months for as long as you are taking the medicine. Follow these instructions at  home: Lifestyle  Do not use any products that contain nicotine or tobacco, such as cigarettes, e-cigarettes, and chewing tobacco. If you need help quitting, ask your health care provider.  Do not use street drugs.  Do not share needles.  Ask your health care provider for help if you need support or information about quitting drugs. Alcohol use  Do not drink alcohol if your health care provider tells you not to drink.  If you drink alcohol: ? Limit how much you have to 0-2 drinks a day. ? Be aware of how much alcohol is in your drink. In the [place] one drink equals one 12 oz bottle of beer (355 mL), one 5 oz glass of wine (148 mL), or one 1 oz glass of hard liquor (44 mL). General instructions  Schedule regular health, dental, and eye exams.  Stay current with your vaccines.  Tell your health care provider if: ? You often feel depressed. ? You have ever been abused or do not feel safe at home. Summary  Adopting a healthy lifestyle and getting preventive care are important in promoting health and wellness.  Follow your health care provider's instructions about healthy diet, exercising, and getting tested or screened for diseases.  Follow your health care provider's instructions on monitoring your cholesterol and blood pressure. This information is not intended to replace advice given to you by your health care provider. Make sure you discuss any questions you have with your health care provider. Document Revised: 05/21/2018 Document Reviewed: 05/21/2018 Elsevier Patient Education  2020 [employer].    [place] K. Anavi Branscum M.D.

## 2020-03-23 ENCOUNTER — Ambulatory Visit (INDEPENDENT_AMBULATORY_CARE_PROVIDER_SITE_OTHER): Payer: BC Managed Care – PPO | Admitting: Internal Medicine

## 2020-03-23 ENCOUNTER — Encounter: Payer: Self-pay | Admitting: Internal Medicine

## 2020-03-23 ENCOUNTER — Other Ambulatory Visit: Payer: Self-pay

## 2020-03-23 VITALS — BP 150/90 | HR 73 | Temp 98.1°F | Ht 66.5 in | Wt 160.6 lb

## 2020-03-23 DIAGNOSIS — Z0000 Encounter for general adult medical examination without abnormal findings: Secondary | ICD-10-CM

## 2020-03-23 DIAGNOSIS — R945 Abnormal results of liver function studies: Secondary | ICD-10-CM | POA: Diagnosis not present

## 2020-03-23 DIAGNOSIS — Z125 Encounter for screening for malignant neoplasm of prostate: Secondary | ICD-10-CM | POA: Diagnosis not present

## 2020-03-23 DIAGNOSIS — R7989 Other specified abnormal findings of blood chemistry: Secondary | ICD-10-CM

## 2020-03-23 DIAGNOSIS — Z0001 Encounter for general adult medical examination with abnormal findings: Secondary | ICD-10-CM

## 2020-03-23 DIAGNOSIS — D869 Sarcoidosis, unspecified: Secondary | ICD-10-CM

## 2020-03-23 DIAGNOSIS — Z79899 Other long term (current) drug therapy: Secondary | ICD-10-CM

## 2020-03-23 DIAGNOSIS — I10 Essential (primary) hypertension: Secondary | ICD-10-CM | POA: Diagnosis not present

## 2020-03-23 DIAGNOSIS — E785 Hyperlipidemia, unspecified: Secondary | ICD-10-CM

## 2020-03-23 DIAGNOSIS — M1612 Unilateral primary osteoarthritis, left hip: Secondary | ICD-10-CM

## 2020-03-23 NOTE — Patient Instructions (Addendum)
Make sure to get your flu shot this year.   Make your follow up with pulmonary    Prev dr [facility] .  [address] [place], [place] [address]. Main Line: [phone].  Fax: [phone]. [facility] ... Will notify you  of labs when available.  Please bring your blood pressure cuff to next appointment Take blood pressure readings twice a day for 5-7  days and record .     Take 2 -3 readings at each sitting .  Can send in readings  by My Chart.     Before checking your blood pressure make sure: You are seated and quite for 5 min before checking Feet are flat on the floor Siting in chair with your back supported straight up and down Arm resting on table or arm of chair at heart level Bladder is empty You have NOT had caffeine or tobacco within the last 30 min  I fneeded we can change or add to BP medication.  Current goal for healthy people is 120/80 as possible.    Health Maintenance, Male Adopting a healthy lifestyle and getting preventive care are important in promoting health and wellness. Ask your health care provider about:  The right schedule for you to have regular tests and exams.  Things you can do on your own to prevent diseases and keep yourself healthy. What should I know about diet, weight, and exercise? Eat a healthy diet   Eat a diet that includes plenty of vegetables, fruits, low-fat dairy products, and lean protein.  Do not eat a lot of foods that are high in solid fats, added sugars, or sodium. Maintain a healthy weight Body mass index (BMI) is a measurement that can be used to identify possible weight problems. It estimates body fat based on height and weight. Your health care provider can help determine your BMI and help you achieve or maintain a healthy weight. Get regular exercise Get regular exercise. This is one of the most important things you can do for your health. Most adults  should:  Exercise for at least 150 minutes each week. The exercise should increase your heart rate and make you sweat (moderate-intensity exercise).  Do strengthening exercises at least twice a week. This is in addition to the moderate-intensity exercise.  Spend less time sitting. Even light physical activity can be beneficial. Watch cholesterol and blood lipids Have your blood tested for lipids and cholesterol at 60 years of age, then have this test every 5 years. You may need to have your cholesterol levels checked more often if:  Your lipid or cholesterol levels are high.  You are older than 60 years of age.  You are at high risk for heart disease. What should I know about cancer screening? Many types of cancers can be detected early and may often be prevented. Depending on your health history and family history, you may need to have cancer screening at various ages. This may include screening for:  Colorectal cancer.  Prostate cancer.  Skin cancer.  Lung cancer. What should I know about heart disease, diabetes, and high blood pressure? Blood pressure and heart disease  High blood pressure causes heart disease and increases the risk of stroke. This is more likely to develop in people who have high blood pressure readings, are of African descent, or are overweight.  Talk with your health care provider about your target blood pressure readings.  Have your blood pressure checked: ? Every  3-5 years if you are 76-60 years of age. ? Every year if you are 70 years old or older.  If you are between the ages of 50 and 64 and are a current or former smoker, ask your health care provider if you should have a one-time screening for abdominal aortic aneurysm (AAA). Diabetes Have regular diabetes screenings. This checks your fasting blood sugar level. Have the screening done:  Once every three years after age 9 if you are at a normal weight and have a low risk for diabetes.  More  often and at a younger age if you are overweight or have a high risk for diabetes. What should I know about preventing infection? Hepatitis B If you have a higher risk for hepatitis B, you should be screened for this virus. Talk with your health care provider to find out if you are at risk for hepatitis B infection. Hepatitis C Blood testing is recommended for:  Everyone born from [birth year] through 1965.  Anyone with known risk factors for hepatitis C. Sexually transmitted infections (STIs)  You should be screened each year for STIs, including gonorrhea and chlamydia, if: ? You are sexually active and are younger than 60 years of age. ? You are older than 60 years of age and your health care provider tells you that you are at risk for this type of infection. ? Your sexual activity has changed since you were last screened, and you are at increased risk for chlamydia or gonorrhea. Ask your health care provider if you are at risk.  Ask your health care provider about whether you are at high risk for HIV. Your health care provider may recommend a prescription medicine to help prevent HIV infection. If you choose to take medicine to prevent HIV, you should first get tested for HIV. You should then be tested every 3 months for as long as you are taking the medicine. Follow these instructions at home: Lifestyle  Do not use any products that contain nicotine or tobacco, such as cigarettes, e-cigarettes, and chewing tobacco. If you need help quitting, ask your health care provider.  Do not use street drugs.  Do not share needles.  Ask your health care provider for help if you need support or information about quitting drugs. Alcohol use  Do not drink alcohol if your health care provider tells you not to drink.  If you drink alcohol: ? Limit how much you have to 0-2 drinks a day. ? Be aware of how much alcohol is in your drink. In the [place] one drink equals one 12 oz bottle of beer (355 mL), one 5  oz glass of wine (148 mL), or one 1 oz glass of hard liquor (44 mL). General instructions  Schedule regular health, dental, and eye exams.  Stay current with your vaccines.  Tell your health care provider if: ? You often feel depressed. ? You have ever been abused or do not feel safe at home. Summary  Adopting a healthy lifestyle and getting preventive care are important in promoting health and wellness.  Follow your health care provider's instructions about healthy diet, exercising, and getting tested or screened for diseases.  Follow your health care provider's instructions on monitoring your cholesterol and blood pressure. This information is not intended to replace advice given to you by your health care provider. Make sure you discuss any questions you have with your health care provider. Document Revised: 05/21/2018 Document Reviewed: 05/21/2018 Elsevier Patient Education  2020 [employer].

## 2020-03-24 LAB — CBC WITH DIFFERENTIAL/PLATELET
Absolute Monocytes: 511 cells/uL (ref 200–950)
Basophils Absolute: 31 cells/uL (ref 0–200)
Basophils Relative: 0.8 %
Eosinophils Absolute: 101 cells/uL (ref 15–500)
Eosinophils Relative: 2.6 %
HCT: 46 % (ref 38.5–50.0)
Hemoglobin: 15.3 g/dL (ref 13.2–17.1)
Lymphs Abs: 1229 cells/uL (ref 850–3900)
MCH: 27.8 pg (ref 27.0–33.0)
MCHC: 33.3 g/dL (ref 32.0–36.0)
MCV: 83.6 fL (ref 80.0–100.0)
MPV: 9.5 fL (ref 7.5–12.5)
Monocytes Relative: 13.1 %
Neutro Abs: 2028 cells/uL (ref 1500–7800)
Neutrophils Relative %: 52 %
Platelets: 239 10*3/uL (ref 140–400)
RBC: 5.5 10*6/uL (ref 4.20–5.80)
RDW: 12.9 % (ref 11.0–15.0)
Total Lymphocyte: 31.5 %
WBC: 3.9 10*3/uL (ref 3.8–10.8)

## 2020-03-24 LAB — HEPATIC FUNCTION PANEL
AG Ratio: 1.8 (calc) (ref 1.0–2.5)
ALT: 21 U/L (ref 9–46)
AST: 19 U/L (ref 10–35)
Albumin: 5.1 g/dL (ref 3.6–5.1)
Alkaline phosphatase (APISO): 89 U/L (ref 35–144)
Bilirubin, Direct: 0.2 mg/dL (ref 0.0–0.2)
Globulin: 2.9 g/dL (calc) (ref 1.9–3.7)
Indirect Bilirubin: 0.8 mg/dL (calc) (ref 0.2–1.2)
Total Bilirubin: 1 mg/dL (ref 0.2–1.2)
Total Protein: 8 g/dL (ref 6.1–8.1)

## 2020-03-24 LAB — HEMOGLOBIN A1C
Hgb A1c MFr Bld: 5.9 % of total Hgb — ABNORMAL HIGH (ref <5.7)
Mean Plasma Glucose: 123 (calc)
eAG (mmol/L): 6.8 (calc)

## 2020-03-24 LAB — BASIC METABOLIC PANEL WITH GFR
BUN: 16 mg/dL (ref 7–25)
CO2: 29 mmol/L (ref 20–32)
Calcium: 9.6 mg/dL (ref 8.6–10.3)
Chloride: 101 mmol/L (ref 98–110)
Creat: 1.07 mg/dL (ref 0.70–1.25)
GFR, Est African American: 87 mL/min/{1.73_m2} (ref >=60)
GFR, Est Non African American: 75 mL/min/{1.73_m2} (ref >=60)
Glucose, Bld: 93 mg/dL (ref 65–99)
Potassium: 3.9 mmol/L (ref 3.5–5.3)
Sodium: 139 mmol/L (ref 135–146)

## 2020-03-24 LAB — LIPID PANEL
Cholesterol: 160 mg/dL (ref <200)
HDL: 52 mg/dL (ref >=40)
LDL Cholesterol (Calc): 94 mg/dL (calc)
Non-HDL Cholesterol (Calc): 108 mg/dL (calc) (ref <130)
Total CHOL/HDL Ratio: 3.1 (calc) (ref <5.0)
Triglycerides: 54 mg/dL (ref <150)

## 2020-03-24 LAB — TSH: TSH: 0.82 mIU/L (ref 0.40–4.50)

## 2020-03-24 LAB — PSA: PSA: 0.78 ng/mL (ref <=4.0)

## 2020-03-24 NOTE — Progress Notes (Signed)
Lab results are good and in range  a1c is borderline elevated but no diabetes .

## 2020-03-26 ENCOUNTER — Other Ambulatory Visit: Payer: Self-pay | Admitting: Internal Medicine

## 2020-04-25 ENCOUNTER — Other Ambulatory Visit: Payer: Self-pay | Admitting: Internal Medicine

## 2020-05-08 ENCOUNTER — Other Ambulatory Visit: Payer: Self-pay | Admitting: Internal Medicine

## 2020-05-11 ENCOUNTER — Institutional Professional Consult (permissible substitution): Payer: BC Managed Care – PPO | Admitting: Internal Medicine

## 2020-05-19 ENCOUNTER — Ambulatory Visit: Payer: Self-pay | Attending: Internal Medicine

## 2020-05-19 DIAGNOSIS — Z23 Encounter for immunization: Secondary | ICD-10-CM

## 2020-05-19 NOTE — Progress Notes (Signed)
   Covid-19 Vaccination Clinic  Name:  Tyler Barrera    MRN: [id number] DOB: September 05, 1960  05/19/2020  [name] was observed post Covid-19 immunization for 15 minutes without incident. He was provided with Vaccine Information Sheet and instruction to access the V-Safe system.   [name] was instructed to call 911 with any severe reactions post vaccine: Marland Kitchen Difficulty breathing  . Swelling of face and throat  . A fast heartbeat  . A bad rash all over body  . Dizziness and weakness   Immunizations Administered    No immunizations on file.

## 2020-05-30 ENCOUNTER — Other Ambulatory Visit: Payer: Self-pay

## 2020-05-30 ENCOUNTER — Ambulatory Visit (INDEPENDENT_AMBULATORY_CARE_PROVIDER_SITE_OTHER): Payer: BC Managed Care – PPO | Admitting: Pulmonary Disease

## 2020-05-30 ENCOUNTER — Encounter: Payer: Self-pay | Admitting: Pulmonary Disease

## 2020-05-30 VITALS — BP 134/72 | HR 77 | Ht 66.0 in | Wt 164.8 lb

## 2020-05-30 DIAGNOSIS — D869 Sarcoidosis, unspecified: Secondary | ICD-10-CM

## 2020-05-30 NOTE — Patient Instructions (Signed)
We are happy to see you again in the pulmonary clinic I am glad you are doing well with your breathing Seems that the sarcoidosis has been stable We will get a high-resolution CT and pulmonary function test for baseline evaluation  Follow-up in 6 months.

## 2020-05-30 NOTE — Progress Notes (Signed)
Tyler Barrera    [id number]    1961/03/27  Primary Care Physician:Panosh, Neta Mends, MD  Referring Physician: Madelin Headings, MD [address] [place],  [place] [address]  Chief complaint: Consult for sarcoidosis  HPI: 59 year old with history of sarcoidosis Diagnosed with transbronchial lung biopsy in 2007.  He was briefly on prednisone after diagnosis.  Has not required any treatment after that with no symptoms Denies any dyspnea on exertion, cough.  He has congestion during the winter months but otherwise okay the rest of the year.  Previously followed by Dr. Jamison Neighbor.  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  No feather pillows or comforters Smoking history: Never smoker Travel history: No significant travel history Relevant family history: No family history of lung disease.  Outpatient Encounter Medications as of 05/30/2020  Medication Sig  . clobetasol ointment (TEMOVATE) 0.05 %   . potassium chloride SA (KLOR-CON M20) 20 MEQ tablet Take 1 tablet (20 mEq total) by mouth daily.  . simvastatin (ZOCOR) 40 MG tablet TAKE 1 TABLET BY MOUTH EVERY DAY AT BEDTIME  . triamterene-hydrochlorothiazide (MAXZIDE-25) 37.5-25 MG tablet Take 1 tablet by mouth daily.  . [DISCONTINUED] potassium chloride SA (KLOR-CON M20) 20 MEQ tablet Take 1 tablet (20 mEq total) by mouth daily.  . [DISCONTINUED] gabapentin (NEURONTIN) 100 MG capsule TAKE 2 CAPSULES (200 MG TOTAL) BY MOUTH AT BEDTIME.   No facility-administered encounter medications on file as of 05/30/2020.    Allergies as of 05/30/2020 - Review Complete 05/30/2020  Allergen Reaction Noted  . Prednisone  06/23/2011    Past Medical History:  Diagnosis Date  . Hyperlipidemia   . Hypertension   . Pulmonary sarcoidosis (HCC)    Dr Shelle Iron     Past  Surgical History:  Procedure Laterality Date  . BRONCHOSCOPY      Family History  Problem Relation Age of Onset  . Hypertension Mother   . Hyperlipidemia Mother   . Hyperlipidemia Father   . Hypertension Father   . Prostate cancer Father   . Kidney failure Father   . Sarcoidosis Neg Hx   . Rheumatologic disease Neg Hx   . Lung disease Neg Hx     Social History   Socioeconomic History  . Marital status: Married    Spouse name: Not on file  . Number of children: Not on file  . Years of education: Not on file  . Highest education level: Not on file  Occupational History  . Not on file  Tobacco Use  . Smoking status: Never Smoker  . Smokeless tobacco: Never Used  Substance and Sexual Activity  . Alcohol use: Yes    Alcohol/week: 0.0 standard drinks    Comment: Occasional glass of wine.  . Drug use: No  . Sexual activity: Not on file  Other Topics Concern  . Not on file  Social History Narrative   Household of three married   Non-smoker regular exercise   [facility] 40 hours plus   Self-employed carpet cleaning.   No pets    Son at [employer] graduated Another teen at home.   Soc etoh  caffiene tea some soda minimal   Exercises        Pulmonary:   Originally from [place]. Always lived in [place]. Prior  travel to [place], [place], [place], [place], [place], & [place]. He feels he has been to pretty much the entire continental [place] state. No international travel. Currently works cleaning carpets. No pets currently. No bid exposure. No mold or hot tub exposure.    Social Determinants of Health   Financial Resource Strain: Not on file  Food Insecurity: Not on file  Transportation Needs: Not on file  Physical Activity: Not on file  Stress: Not on file  Social Connections: Not on file  Intimate Partner Violence: Not on file    Review of systems: Review of Systems  Constitutional: Negative for fever and chills.  HENT: Negative.   Eyes: Negative for blurred vision.  Respiratory: as  per HPI  Cardiovascular: Negative for chest pain and palpitations.  Gastrointestinal: Negative for vomiting, diarrhea, blood per rectum. Genitourinary: Negative for dysuria, urgency, frequency and hematuria.  Musculoskeletal: Negative for myalgias, back pain and joint pain.  Skin: Negative for itching and rash.  Neurological: Negative for dizziness, tremors, focal weakness, seizures and loss of consciousness.  Endo/Heme/Allergies: Negative for environmental allergies.  Psychiatric/Behavioral: Negative for depression, suicidal ideas and hallucinations.  All other systems reviewed and are negative.  Physical Exam: Blood pressure 134/72, pulse 77, height 5\' 6"  (1.676 m), weight 164 lb 12.8 oz (74.8 kg), SpO2 99 %. Gen:      No acute distress HEENT:  EOMI, sclera anicteric Neck:     No masses; no thyromegaly Lungs:    Clear to auscultation bilaterally; normal respiratory effort CV:         Regular rate and rhythm; no murmurs Abd:      + bowel sounds; soft, non-tender; no palpable masses, no distension Ext:    No edema; adequate peripheral perfusion Skin:      Warm and dry; no rash Neuro: alert and oriented x 3 Psych: normal mood and affect  Data Reviewed: Imaging: CT chest 09/07/2005-mediastinal, hilar lymphadenopathy with upper lobe pulmonary nodules. Chest x-ray 01/30/2018-stable scarring in the upper lobes I have reviewed the images personally.  PFTs: 03/14/2017 FVC 2.87 [82%], FEV1 2.65 [96%], F/F 92, TLC 4.40 [71%], DLCO 21.74 [80%] Mild restriction  Labs: Comprehensive metabolic panel 03/23/2020-within normal limits  09/19/05-lung biopsy pathology The sections show fragments of benign alveolar and bronchial  tissues. The bronchial tissue displays multiple small  epithelioid granulomata associated with multinucleated giant  cells. No necrosis is identified. The features are most  suggestive of sarcoidosis.  Assessment:  Sarcoidosis Has been stable over the years and  likely inactive He is here for routine follow-up Recent LFTs are normal He is getting regular ophthalmology visits  Schedule high-res CT and PFTs for baseline assessment Follow-up in 6 months  Plan/Recommendations: High-res CT, PFTs  11/19/05 MD  Pulmonary and Critical Care 05/30/2020, 9:54 AM  CC: Panosh, 06/01/2020, MD

## 2020-06-06 ENCOUNTER — Other Ambulatory Visit: Payer: Self-pay

## 2020-06-08 ENCOUNTER — Inpatient Hospital Stay: Admission: RE | Admit: 2020-06-08 | Payer: Self-pay | Source: Ambulatory Visit

## 2021-01-16 ENCOUNTER — Ambulatory Visit: Payer: Self-pay | Admitting: Acute Care

## 2021-02-03 ENCOUNTER — Ambulatory Visit: Payer: Self-pay | Admitting: Adult Health

## 2021-02-24 ENCOUNTER — Ambulatory Visit: Payer: Self-pay | Admitting: Adult Health

## 2021-03-14 NOTE — Progress Notes (Signed)
@Tyler Barrera  ID: , male    DOB: 19-May-1961, 60 y.o.   MRN: [id number]  Chief Complaint  Tyler Barrera presents with   Follow-up    PFT performed today.  Pt states he has been doing okay since last visit and denies any complaints.    Referring provider: [id number], MD  HPI: 60 year old male, never smoked.  Past medical history significant for sarcoidosis. Tyler Barrera of Dr. 67, last seen on 05/30/20.   03/15/2021 Tyler Barrera presents today for overdue follow-up/ PFTs. He is doing well, no acute complaints. He has no active respiratory symptoms. Needs HRCT imaging. Denies shortness of breath, chest pain/discomfort and cough.    Pulmonary function testing: 03/15/2021- FVC 2.80 (81%), FEV1 2.60 (97%), ratio 93, TLC 74%, DLCOcor 21.20 (87%) Normal spirometry and diffusion capacity. No BD response. Lung volumes confirm mild restriction   03/14/2017- FVC 2.87 (82%), FEV1 2.65 (96%), ratio 92, TLC 71%, DLCOcor 22.25 (82%) Spirometry suggests restriction without concomitant obstruction. There is no significant bronchodilator response. Lung volumes confirm mild restriction. Carbon monoxide diffusion capacity corrected for hemoglobin is normal.   Allergies  Allergen Reactions   Prednisone     Nausea, "Out of it" . Note: He was also on Vicodin simultaneously    Immunization History  Administered Date(s) Administered   Influenza Split 04/11/2013, 03/12/2015   Moderna SARS-COV2 Booster Vaccination 05/19/2020   Moderna Sars-Covid-2 Vaccination 08/13/2019, 09/15/2019    Past Medical History:  Diagnosis Date   Hyperlipidemia    Hypertension    Pulmonary sarcoidosis (HCC)    Dr 11/15/2019     Tobacco History: Social History   Tobacco Use  Smoking Status Never  Smokeless Tobacco Never   Counseling given: Not Answered   Outpatient Medications Prior to Visit  Medication Sig Dispense Refill   clobetasol ointment (TEMOVATE) 0.05 %      potassium chloride SA (KLOR-CON M20) 20 MEQ  tablet Take 1 tablet (20 mEq total) by mouth daily. 90 tablet 3   simvastatin (ZOCOR) 40 MG tablet TAKE 1 TABLET BY MOUTH EVERY DAY AT BEDTIME 90 tablet 3   triamterene-hydrochlorothiazide (MAXZIDE-25) 37.5-25 MG tablet Take 1 tablet by mouth daily. 90 tablet 3   No facility-administered medications prior to visit.    Review of Systems  Review of Systems  Constitutional: Negative.   HENT: Negative.    Respiratory: Negative.      Physical Exam  BP 120/72 (BP Location: Left Arm, Tyler Barrera Position: Sitting, Cuff Size: Normal)   Pulse 74   Temp 98.1 F (36.7 C) (Oral)   Ht 5\' 6"  (1.676 m)   Wt 156 lb (70.8 kg)   SpO2 98% Comment: RA  BMI 25.18 kg/m  Physical Exam Constitutional:      Appearance: Normal appearance.  HENT:     Head: Normocephalic and atraumatic.     Mouth/Throat:     Comments: Deferred d/t masking  Cardiovascular:     Rate and Rhythm: Normal rate and regular rhythm.  Musculoskeletal:        General: Normal range of motion.  Skin:    General: Skin is warm and dry.  Neurological:     General: No focal deficit present.     Mental Status: He is alert and oriented to person, place, and time. Mental status is at baseline.  Psychiatric:        Mood and Affect: Mood normal.        Behavior: Behavior normal.        Thought Content: Thought  content normal.        Judgment: Judgment normal.     Lab Results:  CBC    Component Value Date/Time   WBC 3.9 03/23/2020 1110   RBC 5.50 03/23/2020 1110   HGB 15.3 03/23/2020 1110   HCT 46.0 03/23/2020 1110   PLT 239 03/23/2020 1110   MCV 83.6 03/23/2020 1110   MCH 27.8 03/23/2020 1110   MCHC 33.3 03/23/2020 1110   RDW 12.9 03/23/2020 1110   LYMPHSABS 1,229 03/23/2020 1110   MONOABS 0.5 02/17/2019 1019   EOSABS 101 03/23/2020 1110   BASOSABS 31 03/23/2020 1110    BMET    Component Value Date/Time   NA 139 03/23/2020 1110   K 3.9 03/23/2020 1110   CL 101 03/23/2020 1110   CO2 29 03/23/2020 1110   GLUCOSE  93 03/23/2020 1110   BUN 16 03/23/2020 1110   CREATININE 1.07 03/23/2020 1110   CALCIUM 9.6 03/23/2020 1110   GFRNONAA 75 03/23/2020 1110   GFRAA 87 03/23/2020 1110    BNP No results found for: BNP  ProBNP No results found for: PROBNP  Imaging: No results found.   Assessment & Plan:   Restrictive lung disease - Pulmonary function testing today showed normal spirometry, lung volumes suggest mild restriction. No significant change compared to 2018   PULMONARY SARCOIDOSIS - No active respiratory symptoms. PFTs are stable.  - Tyler Barrera needs annual opthalmology exam  - Due for repeat CT chest imaging    Tyler Bayley, NP 03/15/2021

## 2021-03-15 ENCOUNTER — Other Ambulatory Visit: Payer: Self-pay

## 2021-03-15 ENCOUNTER — Telehealth: Payer: Self-pay | Admitting: Primary Care

## 2021-03-15 ENCOUNTER — Ambulatory Visit (INDEPENDENT_AMBULATORY_CARE_PROVIDER_SITE_OTHER): Payer: BC Managed Care – PPO | Admitting: Pulmonary Disease

## 2021-03-15 ENCOUNTER — Encounter: Payer: Self-pay | Admitting: Primary Care

## 2021-03-15 ENCOUNTER — Ambulatory Visit (INDEPENDENT_AMBULATORY_CARE_PROVIDER_SITE_OTHER): Payer: BC Managed Care – PPO | Admitting: Primary Care

## 2021-03-15 DIAGNOSIS — J984 Other disorders of lung: Secondary | ICD-10-CM | POA: Diagnosis not present

## 2021-03-15 DIAGNOSIS — D869 Sarcoidosis, unspecified: Secondary | ICD-10-CM

## 2021-03-15 LAB — PULMONARY FUNCTION TEST
DL/VA % pred: 119 %
DL/VA: 5.15 ml/min/mmHg/L
DLCO cor % pred: 87 %
DLCO cor: 21.2 ml/min/mmHg
DLCO unc % pred: 87 %
DLCO unc: 21.2 ml/min/mmHg
FEF 25-75 Post: 4.54 L/sec
FEF 25-75 Pre: 3.6 L/sec
FEF2575-%Change-Post: 26 %
FEF2575-%Pred-Post: 178 %
FEF2575-%Pred-Pre: 141 %
FEV1-%Change-Post: 5 %
FEV1-%Pred-Post: 97 %
FEV1-%Pred-Pre: 92 %
FEV1-Post: 2.6 L
FEV1-Pre: 2.46 L
FEV1FVC-%Change-Post: 5 %
FEV1FVC-%Pred-Pre: 112 %
FEV6-%Change-Post: 0 %
FEV6-%Pred-Post: 84 %
FEV6-%Pred-Pre: 84 %
FEV6-Post: 2.8 L
FEV6-Pre: 2.8 L
FEV6FVC-%Pred-Post: 104 %
FEV6FVC-%Pred-Pre: 104 %
FVC-%Change-Post: 0 %
FVC-%Pred-Post: 81 %
FVC-%Pred-Pre: 81 %
FVC-Post: 2.8 L
FVC-Pre: 2.8 L
Post FEV1/FVC ratio: 93 %
Post FEV6/FVC ratio: 100 %
Pre FEV1/FVC ratio: 88 %
Pre FEV6/FVC Ratio: 100 %
RV % pred: 85 %
RV: 1.73 L
TLC % pred: 74 %
TLC: 4.62 L

## 2021-03-15 NOTE — Assessment & Plan Note (Signed)
-   Pulmonary function testing today showed normal spirometry, lung volumes suggest mild restriction. No significant change compared to 2018

## 2021-03-15 NOTE — Telephone Encounter (Signed)
Can we scheduled HRCT that was ordered by Dr. Isaiah Serge back in Dec 2021 or do I need to re-enter order?

## 2021-03-15 NOTE — Assessment & Plan Note (Addendum)
-   No active respiratory symptoms. PFTs are stable.  - Patient needs annual opthalmology exam  - Due for repeat CT chest imaging

## 2021-03-15 NOTE — Patient Instructions (Addendum)
Nice seeing you today Mr. Tyler Barrera  Pulmonary function testing today looked great, you have normal lung function and there has been no change since your last test in 2018.   You are due for repeat CT chest which we will order and notify you of date/time  Make sure to have annual eye exam with opthalmology   Follow-up: 1 year with Dr. Isaiah Serge or sooner if you develop any respiratory symptoms    Sarcoidosis Sarcoidosis is a disease that can cause inflammation in many areas of the body. It most often affects the lungs (pulmonary sarcoidosis). Sarcoidosis can also affect the lymph nodes, liver, eyes, skin, heart, or any other body tissue. Normally, cells that are part of the body's disease-fighting system (immune system) attack harmful substances in the body, such as germs. This immune system response causes inflammation. After the harmful substance is destroyed, the inflammation goes away. When you have sarcoidosis, your immune system causes inflammation even when there are no harmful substances, and the inflammation does not go away. Sarcoidosis also causes cells from your immune system to form small lumps (granulomas) in the affected area of your body. What are the causes? The exact cause of sarcoidosis is not known.  If you have a family history of this disease (genetic predisposition), the immune system response that leads to inflammation may be triggered by something in your environment, such as: Bacteria or viruses. Metals. Chemicals. Dust. Mold or mildew. What increases the risk? You may be more likely to develop this condition if you: Have a family history of the disease. Are African American. Are of Northern European descent. Are 64-49 years old. Are male. Work as a IT sales professional. Work in an environment where you are exposed to metals, chemicals, mold or mildew, or insecticides. What are the signs or symptoms? Some people with sarcoidosis have no symptoms. Others have very mild  symptoms. The symptoms usually depend on the organ that is affected. Sarcoidosis most often affects the lungs, which may lead to symptoms such as: Chest pain. Coughing. Wheezing. Shortness of breath. Other common symptoms include: Night sweats. Fever. Weight loss. Tiredness (fatigue). Swollen lymph nodes. Joint pain. How is this diagnosed? This condition may be diagnosed based on: Your symptoms and medical history. A physical exam. Imaging tests such as: Chest X-ray. CT scan. MRI. PET scan. Lung function tests. These tests evaluate your breathing and check for problems that may be related to sarcoidosis. A procedure to remove a tissue sample for testing (biopsy). You may have a biopsy of lung tissue if that is where you are having symptoms. You may have tests to check for any complications of the condition. These tests may include: Eye exams. MRI of the heart or brain. Echocardiogram. ECG (electrocardiogram). How is this treated? In some cases, sarcoidosis does not require a specific treatment because it causes no symptoms or only mild symptoms. If your symptoms bother you or are severe, you may be prescribed medicines to reduce inflammation or relieve symptoms. These medicines may include: Prednisone. This is a steroid that reduces inflammation related to sarcoidosis. Hydroxychloroquine. This may be used to treat sarcoidosis that affects the skin, eyes, or brain. Certain medicines that affect the immune system. These can help with sarcoidosis in the joints, eyes, skin, or lungs. Medicines that you breathe in (inhalers). Inhalers can help you breathe if sarcoidosis affects your lungs. Follow these instructions at home:  Do not use any products that contain nicotine or tobacco. These products include cigarettes, chewing tobacco, and vaping  devices, such as e-cigarettes. If you need help quitting, ask your health care provider. Avoid secondhand smoke and irritating dust or  chemicals. Stay indoors on days when air quality is poor in your area. Return to your normal activities as told by your health care provider. Ask your health care provider what activities are safe for you. Take or use over-the-counter and prescription medicines only as told by your health care provider. Keep all follow-up visits. This is important. Where to find more information National Heart, Lung, and Blood Institute: [employer].is Contact a health care provider if: You have vision problems. You have a dry cough that does not go away. You have an irregular heartbeat. You have pain or aches in your joints, hands, or feet. You have an unexplained rash. Get help right away if: You have chest pain. You have trouble breathing. These symptoms may represent a serious problem that is an emergency. Do not wait to see if the symptoms will go away. Get medical help right away. Call your local emergency services (911 in the [place] Do not drive yourself to the hospital. Summary Sarcoidosis is a disease that can cause inflammation in many body areas of the body. It most often affects the lungs (pulmonary sarcoidosis). It can also affect the lymph nodes, liver, eyes, skin, heart, or any other body tissue. When you have sarcoidosis, cells from your immune system form small lumps (granulomas) in the affected area of your body. Sarcoidosis sometimes does not require a specific treatment because it causes no symptoms or only mild symptoms. If your symptoms bother you or are severe, you may be prescribed medicines to reduce inflammation or relieve symptoms. This information is not intended to replace advice given to you by your health care provider. Make sure you discuss any questions you have with your health care provider. Document Revised: 03/29/2020 Document Reviewed: 03/29/2020 Elsevier Patient Education  2022 [employer].

## 2021-03-15 NOTE — Progress Notes (Signed)
PFT done today. 

## 2021-03-17 NOTE — Telephone Encounter (Signed)
It was? I do not see it in [facility], was it done somewhere else?

## 2021-03-17 NOTE — Telephone Encounter (Signed)
Wonderful, thanks!  

## 2021-03-17 NOTE — Telephone Encounter (Signed)
Ok, thanks.

## 2021-03-31 ENCOUNTER — Inpatient Hospital Stay: Admission: RE | Admit: 2021-03-31 | Payer: BC Managed Care – PPO | Source: Ambulatory Visit

## 2021-04-25 ENCOUNTER — Ambulatory Visit: Payer: BC Managed Care – PPO | Attending: Family

## 2021-04-25 DIAGNOSIS — Z23 Encounter for immunization: Secondary | ICD-10-CM

## 2021-04-25 NOTE — Progress Notes (Signed)
   Covid-19 Vaccination Clinic  Name:  Vanden Fawaz    MRN: [id number] DOB: 1960-08-04  04/25/2021  Mr. Stehr was observed post Covid-19 immunization for 15 minutes without incident. He was provided with Vaccine Information Sheet and instruction to access the V-Safe system.   Mr. Scobie was instructed to call 911 with any severe reactions post vaccine: Difficulty breathing  Swelling of face and throat  A fast heartbeat  A bad rash all over body  Dizziness and weakness   Immunizations Administered     Name Date Dose VIS Date Route   Moderna Covid-19 vaccine Bivalent Booster 04/25/2021  9:00 AM 0.5 mL 01/21/2021 Intramuscular   Manufacturer: Moderna   Lot: [id number]   NDC: 06015-615-37

## 2021-05-02 ENCOUNTER — Other Ambulatory Visit: Payer: Self-pay | Admitting: Internal Medicine

## 2021-05-23 ENCOUNTER — Telehealth: Payer: Self-pay | Admitting: Internal Medicine

## 2021-05-23 MED ORDER — POTASSIUM CHLORIDE CRYS ER 20 MEQ PO TBCR: 20.0000 meq | EXTENDED_RELEASE_TABLET | Freq: Every day | ORAL | 0 refills | Status: DC

## 2021-05-23 NOTE — Telephone Encounter (Signed)
Patient called to get refill on potassium chloride SA (KLOR-CON M20) 20 MEQ tablet  Please send to [id number] [place], [place] - [address] Phone:  [phone]  Fax:  [phone]        I did let patient know that provider is out of the office until the 15th. Patient stated he was out of the prescription and wanted it sent in by the Nurse practitioner in our office who is on call, I did let him know that the Nurse practitioner is separate provider and is not on call, but I will send it to the back and they may be able to find a provider that will fill it in Dr.Panosh's absence. Patient insisted that I would send it back to the NP and asked how long it would take for it to be sent in. I repeated my previous statement of sending the message to the back, but I could not guarantee they would be able to fill it with Dr.Panosh being out and normally prescriptions were two to three business days. Patient told me no and that I would send it back to the NP, I repeated myself again, stating that he is not on call, and they would try to see if it could be filled without Dr.Panosh but I could not guarantee anything due to her being out. Patient stated he would call back tomorrow to follow up and I told him that was fine.

## 2021-05-23 NOTE — Telephone Encounter (Signed)
RX sent

## 2021-05-24 ENCOUNTER — Other Ambulatory Visit: Payer: Self-pay | Admitting: Internal Medicine

## 2021-05-28 NOTE — Progress Notes (Signed)
Chief Complaint  Patient presents with   Annual Exam   Medication Management    HPI: Patient  Tyler Barrera  60 y.o. comes in today for Preventive Health Care visit and med evaluation  Pulmonary :sarcoid he has not pulm  follow.  No sx  had covid  but doing ok.  Ht taking medicine no concerns feels fine HLD: Taking simvastatin no concerns. Due for colon screening  soon . Had hip replacement doing well. Up-to-date on flu shot COVID. No major change in GI/GU status    Health Maintenance  Topic Date Due   TETANUS/TDAP  06/11/2016   HEMOGLOBIN A1C  09/21/2020   Zoster Vaccines- Shingrix (1 of 2) 07/30/2021 (Originally 03/18/1980)   INFLUENZA VACCINE  09/08/2021 (Originally 01/09/2021)   Pneumococcal Vaccine 3-42 Years old (1 - PCV) 11/27/2021 (Originally 03/17/1967)   COLONOSCOPY (Pts 45-71yrs Insurance coverage will need to be confirmed)  11/27/2021 (Originally 08/28/2020)   COVID-19 Vaccine (4 - Booster) 06/20/2021   Hepatitis C Screening  Completed   HIV Screening  Completed   HPV VACCINES  Aged Out   Health Maintenance Review LIFESTYLE:  Exercise:   golf in season.  Tobacco/ETS: n Alcohol:  rare Sugar beverages:  minimal  Sleep:   ave 7  Drug use: no HH of  1 no pets.  Work:all the time.   ROS:   REST of 12 system review negative except as per HPI   Past Medical History:  Diagnosis Date   Hyperlipidemia    Hypertension    Pulmonary sarcoidosis (HCC)    Dr Shelle Iron     Past Surgical History:  Procedure Laterality Date   BRONCHOSCOPY      Family History  Problem Relation Age of Onset   Hypertension Mother    Hyperlipidemia Mother    Hyperlipidemia Father    Hypertension Father    Prostate cancer Father    Kidney failure Father    Sarcoidosis Neg Hx    Rheumatologic disease Neg Hx    Lung disease Neg Hx     Social History   Socioeconomic History   Marital status: Married    Spouse name: Not on file   Number of children: Not on file   Years of  education: Not on file   Highest education level: Not on file  Occupational History   Not on file  Tobacco Use   Smoking status: Never   Smokeless tobacco: Never  Substance and Sexual Activity   Alcohol use: Yes    Alcohol/week: 0.0 standard drinks    Comment: Occasional glass of wine.   Drug use: No   Sexual activity: Not on file  Other Topics Concern   Not on file  Social History Narrative   Household of three married   Non-smoker regular exercise   [facility] 40 hours plus   Self-employed carpet cleaning.   No pets    Son at [employer] graduated Another teen at home.   Soc etoh  caffiene tea some soda minimal   Exercises       [facility]:   Originally from [place]. Always lived in [place]. Prior travel to [place], [place], [place], [place], [place], & [place]. He feels he has been to pretty much the entire continental [place] state. No international travel. Currently works cleaning carpets. No pets currently. No bid exposure. No mold or hot tub exposure.    Social Determinants of Health   Financial Resource Strain: Not on file  Food Insecurity:  Not on file  Transportation Needs: Not on file  Physical Activity: Not on file  Stress: Not on file  Social Connections: Not on file    Outpatient Medications Prior to Visit  Medication Sig Dispense Refill   clobetasol ointment (TEMOVATE) 0.05 %      potassium chloride SA (KLOR-CON M20) 20 MEQ tablet Take 1 tablet (20 mEq total) by mouth daily. 30 tablet 0   simvastatin (ZOCOR) 40 MG tablet TAKE 1 TABLET BY MOUTH EVERYDAY AT BEDTIME 30 tablet 0   triamterene-hydrochlorothiazide (MAXZIDE-25) 37.5-25 MG tablet TAKE 1 TABLET BY MOUTH EVERY DAY 30 tablet 0   No facility-administered medications prior to visit.     EXAM:  BP 118/78 (BP Location: Left Arm, Patient Position: Sitting, Cuff Size: Normal)    Pulse 80    Temp 98.3 F (36.8 C) (Oral)    Ht  (1.676 m)    Wt 155 lb 3.2 oz (70.4 kg)    SpO2 96%    BMI 25.05 kg/m   Body mass index is  25.05 kg/m. Wt Readings from Last 3 Encounters:  05/29/21 155 lb 3.2 oz (70.4 kg)  03/15/21 156 lb (70.8 kg)  05/30/20 164 lb 12.8 oz (74.8 kg)  5.6   Physical Exam: Vital signs reviewed ZOX:WRUE is a well-developed well-nourished alert cooperative    who appearsr stated age in no acute distress.  HEENT: normocephalic atraumatic , Eyes: PERRL EOM's full, conjunctiva clear, Nares: paten,t no deformity discharge or tenderness., Ears: no deformity EAC's clear TMs with normal landmarks. Mouth: clear OP, no masked  NECK: supple without masses, thyromegaly or bruits. CHEST/PULM:  Clear to auscultation and percussion breath sounds equal no wheeze , rales or rhonchi. No chest wall deformities or tenderness.  CV: PMI is nondisplaced, S1 S2 no gallops, murmurs, rubs. Peripheral pulses are full without delay.No JVD .  ABDOMEN: Bowel sounds normal nontender  No guard or rebound, no hepato splenomegal no CVA tenderness.   Extremtities:  No clubbing cyanosis or edema, no acute joint swelling or redness no focal atrophy NEURO:  Oriented x3, cranial nerves 3-12 appear to be intact, no obvious focal weakness,gait within normal limits no abnormal reflexes or asymmetrical SKIN: No acute rashes normal turgor, color, no bruising or petechiae. PSYCH: Oriented, good eye contact, no obvious depression anxiety, cognition and judgment appear normal. LN: no cervical axillary inguinal adenopathy  Lab Results  Component Value Date   WBC 3.9 03/23/2020   HGB 15.3 03/23/2020   HCT 46.0 03/23/2020   PLT 239 03/23/2020   GLUCOSE 93 03/23/2020   CHOL 160 03/23/2020   TRIG 54 03/23/2020   HDL 52 03/23/2020   LDLDIRECT 203.0 04/22/2007   LDLCALC 94 03/23/2020   ALT 21 03/23/2020   AST 19 03/23/2020   NA 139 03/23/2020   K 3.9 03/23/2020   CL 101 03/23/2020   CREATININE 1.07 03/23/2020   BUN 16 03/23/2020   CO2 29 03/23/2020   TSH 0.82 03/23/2020   PSA 0.78 03/23/2020   HGBA1C 5.9 (H) 03/23/2020    BP  Readings from Last 3 Encounters:  05/29/21 118/78  03/15/21 120/72  05/30/20 134/72    Lab rplan reviewed with patient   ASSESSMENT AND PLAN:  Discussed the following assessment and plan:    ICD-10-CM   1. Visit for preventive health examination  Z00.00 Basic metabolic panel    CBC with Differential/Platelet    Hepatic function panel    Lipid panel    PSA  TSH    Hemoglobin A1c    Hemoglobin A1c    TSH    PSA    Lipid panel    Hepatic function panel    CBC with Differential/Platelet    Basic metabolic panel    2. Essential hypertension  I10 Basic metabolic panel    CBC with Differential/Platelet    Hepatic function panel    Lipid panel    PSA    TSH    Hemoglobin A1c    Hemoglobin A1c    TSH    PSA    Lipid panel    Hepatic function panel    CBC with Differential/Platelet    Basic metabolic panel    3. Medication management  Z79.899 Basic metabolic panel    CBC with Differential/Platelet    Hepatic function panel    Lipid panel    PSA    TSH    Hemoglobin A1c    Hemoglobin A1c    TSH    PSA    Lipid panel    Hepatic function panel    CBC with Differential/Platelet    Basic metabolic panel    4. Hyperlipidemia, unspecified hyperlipidemia type  E78.5 Basic metabolic panel    CBC with Differential/Platelet    Hepatic function panel    Lipid panel    PSA    TSH    Hemoglobin A1c    Hemoglobin A1c    TSH    PSA    Lipid panel    Hepatic function panel    CBC with Differential/Platelet    Basic metabolic panel    5. Screening PSA (prostate specific antigen)  Z12.5 Basic metabolic panel    CBC with Differential/Platelet    Hepatic function panel    Lipid panel    PSA    TSH    TSH    PSA    Lipid panel    Hepatic function panel    CBC with Differential/Platelet    Basic metabolic panel    6. PULMONARY SARCOIDOSIS  D86.9 Basic metabolic panel    CBC with Differential/Platelet    Hepatic function panel    Lipid panel    PSA    TSH     Hemoglobin A1c    Hemoglobin A1c    TSH    PSA    Lipid panel    Hepatic function panel    CBC with Differential/Platelet    Basic metabolic panel   repor  quiescent     7. S/P hip replacement, left  Z96.642     8. Hyperglycemia  R73.9 Hemoglobin A1c    Hemoglobin A1c   on pred     Consider sign up for my chart  Consider  getting prevnar 20  Updated labs today  monitoring  Return in about 1 year (around 05/29/2022) for preventive /cpx and medications.  Patient Care Team: Madelin Headings, MD as PCP - General Rachael Fee, MD (Gastroenterology) Roslynn Amble, MD as Consulting Physician (Pulmonary Disease) Patient Instructions  Good to see you today . Your exam is normal   Will notify you  of labs when available or  can see on My chart . Consider prevnar 20  (pneumococcal vaccine) can get any time  make injection  nurse appt.   Continue lifestyle intervention healthy eating and exercise .  Get your  colon cancer screening as planned / If all ok then yearly check cpx      Neta Mends.  Archimedes Harold M.D.

## 2021-05-29 ENCOUNTER — Ambulatory Visit (INDEPENDENT_AMBULATORY_CARE_PROVIDER_SITE_OTHER): Payer: BC Managed Care – PPO | Admitting: Internal Medicine

## 2021-05-29 ENCOUNTER — Encounter: Payer: Self-pay | Admitting: Internal Medicine

## 2021-05-29 VITALS — BP 118/78 | HR 80 | Temp 98.3°F | Ht 66.0 in | Wt 155.2 lb

## 2021-05-29 DIAGNOSIS — E785 Hyperlipidemia, unspecified: Secondary | ICD-10-CM | POA: Diagnosis not present

## 2021-05-29 DIAGNOSIS — Z0000 Encounter for general adult medical examination without abnormal findings: Secondary | ICD-10-CM

## 2021-05-29 DIAGNOSIS — I10 Essential (primary) hypertension: Secondary | ICD-10-CM | POA: Diagnosis not present

## 2021-05-29 DIAGNOSIS — Z125 Encounter for screening for malignant neoplasm of prostate: Secondary | ICD-10-CM

## 2021-05-29 DIAGNOSIS — Z96642 Presence of left artificial hip joint: Secondary | ICD-10-CM

## 2021-05-29 DIAGNOSIS — Z79899 Other long term (current) drug therapy: Secondary | ICD-10-CM | POA: Diagnosis not present

## 2021-05-29 DIAGNOSIS — D869 Sarcoidosis, unspecified: Secondary | ICD-10-CM

## 2021-05-29 DIAGNOSIS — R739 Hyperglycemia, unspecified: Secondary | ICD-10-CM

## 2021-05-29 LAB — BASIC METABOLIC PANEL
BUN: 20 mg/dL (ref 6–23)
CO2: 29 mEq/L (ref 19–32)
Calcium: 9.9 mg/dL (ref 8.4–10.5)
Chloride: 102 mEq/L (ref 96–112)
Creatinine, Ser: 1.14 mg/dL (ref 0.40–1.50)
GFR: 69.56 mL/min (ref >60.00)
Glucose, Bld: 94 mg/dL (ref 70–99)
Potassium: 4.3 mEq/L (ref 3.5–5.1)
Sodium: 141 mEq/L (ref 135–145)

## 2021-05-29 LAB — LIPID PANEL
Cholesterol: 161 mg/dL (ref 0–200)
HDL: 49.7 mg/dL (ref >39.00)
LDL Cholesterol: 100 mg/dL — ABNORMAL HIGH (ref 0–99)
NonHDL: 111.23
Total CHOL/HDL Ratio: 3
Triglycerides: 57 mg/dL (ref 0.0–149.0)
VLDL: 11.4 mg/dL (ref 0.0–40.0)

## 2021-05-29 LAB — CBC WITH DIFFERENTIAL/PLATELET
Basophils Absolute: 0 10*3/uL (ref 0.0–0.1)
Basophils Relative: 0.5 % (ref 0.0–3.0)
Eosinophils Absolute: 0.1 10*3/uL (ref 0.0–0.7)
Eosinophils Relative: 3.2 % (ref 0.0–5.0)
HCT: 44.2 % (ref 39.0–52.0)
Hemoglobin: 14.8 g/dL (ref 13.0–17.0)
Lymphocytes Relative: 34.9 % (ref 12.0–46.0)
Lymphs Abs: 1.5 10*3/uL (ref 0.7–4.0)
MCHC: 33.5 g/dL (ref 30.0–36.0)
MCV: 82.3 fl (ref 78.0–100.0)
Monocytes Absolute: 0.5 10*3/uL (ref 0.1–1.0)
Monocytes Relative: 11.5 % (ref 3.0–12.0)
Neutro Abs: 2.2 10*3/uL (ref 1.4–7.7)
Neutrophils Relative %: 49.9 % (ref 43.0–77.0)
Platelets: 271 10*3/uL (ref 150.0–400.0)
RBC: 5.38 Mil/uL (ref 4.22–5.81)
RDW: 13.2 % (ref 11.5–15.5)
WBC: 4.4 10*3/uL (ref 4.0–10.5)

## 2021-05-29 LAB — HEPATIC FUNCTION PANEL
ALT: 32 U/L (ref 0–53)
AST: 26 U/L (ref 0–37)
Albumin: 4.9 g/dL (ref 3.5–5.2)
Alkaline Phosphatase: 89 U/L (ref 39–117)
Bilirubin, Direct: 0.2 mg/dL (ref 0.0–0.3)
Total Bilirubin: 0.9 mg/dL (ref 0.2–1.2)
Total Protein: 8.2 g/dL (ref 6.0–8.3)

## 2021-05-29 LAB — HEMOGLOBIN A1C: Hgb A1c MFr Bld: 6.1 % (ref 4.6–6.5)

## 2021-05-29 LAB — TSH: TSH: 0.64 u[IU]/mL (ref 0.35–5.50)

## 2021-05-29 LAB — PSA: PSA: 0.71 ng/mL (ref 0.10–4.00)

## 2021-05-29 NOTE — Patient Instructions (Addendum)
Good to see you today . Your exam is normal   Will notify you  of labs when available or  can see on My chart . Consider prevnar 20  (pneumococcal vaccine) can get any time  make injection  nurse appt.   Continue lifestyle intervention healthy eating and exercise .  Get your  colon cancer screening as planned / If all ok then yearly check cpx

## 2021-05-29 NOTE — Progress Notes (Signed)
Blood results are stable liver blood sugar thyroid kidney function PSA as well as CBC and differential are all normal. Hemoglobin A1c which is a 90-day assessment of blood sugar is borderline elevated but no evidence of diabetes. Continue healthy lifestyle Follow-up labs CPX in a year Do not forget to sign up for my chart and you can see the actual numbers.

## 2021-05-31 ENCOUNTER — Telehealth: Payer: Self-pay | Admitting: Internal Medicine

## 2021-05-31 NOTE — Telephone Encounter (Signed)
Patient called for lab results, CMA was unavailable so I let him know I would send a message back and they would call back when available.      Good callback number is [phone]    Please advise

## 2021-06-02 NOTE — Telephone Encounter (Signed)
Patient is calling for lab results.

## 2021-06-06 ENCOUNTER — Telehealth: Payer: Self-pay | Admitting: Primary Care

## 2021-06-06 DIAGNOSIS — D869 Sarcoidosis, unspecified: Secondary | ICD-10-CM

## 2021-06-06 NOTE — Telephone Encounter (Signed)
Called and spoke with patient, he states he was following up on any testing that needs to be done before the end of the year and he told his pcp that he would f/u with his pulmonary office.  Advised that he had a CT scan scheduled for 03/31/21 at 9:30 am and that he did not show up for the scan.  He stated that he know nothing about it, he had no card to remind him about it and it was not scheduled when he checked out.  I advised him that this is something that [facility] call and schedule with them after they leave the office.  I let him know that I would get a message to our PCCs and let them know that he would like to reschedule the CT scan.  I let him know that they would call him to schedule the scan.   PCCs: Can you all get him rescheduled for the CT scan that was due in October?  Thank you.

## 2021-06-08 NOTE — Telephone Encounter (Signed)
Before anyone can reschedule the patient a new CT order will have to be placed

## 2021-06-08 NOTE — Telephone Encounter (Signed)
Order placed. Thank you.

## 2021-06-15 ENCOUNTER — Other Ambulatory Visit: Payer: Self-pay | Admitting: Internal Medicine

## 2021-06-16 ENCOUNTER — Inpatient Hospital Stay: Admission: RE | Admit: 2021-06-16 | Payer: BC Managed Care – PPO | Source: Ambulatory Visit

## 2021-06-28 ENCOUNTER — Ambulatory Visit (INDEPENDENT_AMBULATORY_CARE_PROVIDER_SITE_OTHER)
Admission: RE | Admit: 2021-06-28 | Discharge: 2021-06-28 | Disposition: A | Discharge status: Home / Self Care | Payer: BC Managed Care – PPO | Source: Ambulatory Visit | Attending: Primary Care | Admitting: Primary Care

## 2021-06-28 ENCOUNTER — Other Ambulatory Visit: Payer: Self-pay

## 2021-06-28 DIAGNOSIS — D869 Sarcoidosis, unspecified: Secondary | ICD-10-CM | POA: Diagnosis not present

## 2021-06-30 ENCOUNTER — Other Ambulatory Visit: Payer: Self-pay | Admitting: Internal Medicine

## 2021-07-11 NOTE — Progress Notes (Signed)
CT chest findings consistent with sarcoidosis without evidence of new or ongoing disease. Small airway disease. Since his symptoms and PFTs are stable no interventions needed.   Thyroid nodule measuring 3.5 x 3.5 cm, new compared to remote prior examination dated 2007. Recommend thyroid US   Please order thyroid nodule re: nodule

## 2021-07-13 ENCOUNTER — Other Ambulatory Visit: Payer: Self-pay | Admitting: *Deleted

## 2021-07-13 DIAGNOSIS — E041 Nontoxic single thyroid nodule: Secondary | ICD-10-CM

## 2021-07-14 ENCOUNTER — Telehealth: Payer: Self-pay | Admitting: Primary Care

## 2021-07-14 NOTE — Telephone Encounter (Signed)
Glenford Bayley, NP  07/11/2021  2:37 PM EST     CT chest findings consistent with sarcoidosis without evidence of new or ongoing disease. Small airway disease. Since his symptoms and PFTs are stable no interventions needed.    Thyroid nodule measuring 3.5 x 3.5 cm, new compared to remote prior examination dated 2007. Recommend thyroid US    Please order thyroid nodule re: nodule     Patient is aware of need for Thyroid US.  All questions have been answered and patient had no further questions.  Nothing further needed.

## 2021-07-19 ENCOUNTER — Ambulatory Visit
Admission: RE | Admit: 2021-07-19 | Discharge: 2021-07-19 | Disposition: A | Discharge status: Home / Self Care | Payer: BC Managed Care – PPO | Source: Ambulatory Visit | Attending: Primary Care | Admitting: Primary Care

## 2021-07-19 ENCOUNTER — Other Ambulatory Visit: Payer: Self-pay

## 2021-07-19 ENCOUNTER — Other Ambulatory Visit: Payer: Self-pay | Admitting: Internal Medicine

## 2021-07-19 DIAGNOSIS — E041 Nontoxic single thyroid nodule: Secondary | ICD-10-CM

## 2021-07-20 NOTE — Progress Notes (Signed)
So tell patient that  one of the thyroid nodules should have a needle biopsy to make sure benign process. Can make  visit virtual  or in person ok to discuss  and  order.

## 2021-07-20 NOTE — Progress Notes (Signed)
Tyroid nodule seen on CT chest. Ultrasound confirmed patient had 4.8cm interior left thyroid lobe nodule, needs to follow-up with pcp - likely needs bx   Cc: Dr. Regis Bill

## 2021-07-25 ENCOUNTER — Ambulatory Visit (INDEPENDENT_AMBULATORY_CARE_PROVIDER_SITE_OTHER): Payer: BC Managed Care – PPO | Admitting: Internal Medicine

## 2021-07-25 ENCOUNTER — Encounter: Payer: Self-pay | Admitting: Internal Medicine

## 2021-07-25 VITALS — BP 124/70 | HR 77 | Temp 98.7°F | Ht 66.0 in | Wt 160.0 lb

## 2021-07-25 DIAGNOSIS — E041 Nontoxic single thyroid nodule: Secondary | ICD-10-CM | POA: Diagnosis not present

## 2021-07-25 DIAGNOSIS — E042 Nontoxic multinodular goiter: Secondary | ICD-10-CM

## 2021-07-25 NOTE — Progress Notes (Signed)
follow

## 2021-07-25 NOTE — Patient Instructions (Signed)
Good seeing you today  Will order a  FNA  and endocrine referral for the thyroid nodules.  Stay on  cholesterol  lowering medication and Continue lifestyle intervention healthy eating and exercise .

## 2021-07-25 NOTE — Progress Notes (Signed)
Chief Complaint  Patient presents with   Follow-up         HPI: Tyler Barrera 61 y.o. come in with his wife to discuss the findings incidental of the thyroid after CT scan was performed and related to follow-up of his lung condition stable sarcoid.  There were thyroid nodules incidentally seen on CT and after that a thyroid ultrasound was ordered. Here for explanation of results and plan as 1 large nodule was advised to have a fine-needle biopsy and another to follow with yearly ultrasounds by protocol.    ROS: See pertinent positives and negatives per HPI.  Past Medical History:  Diagnosis Date   Hyperlipidemia    Hypertension    Pulmonary sarcoidosis (HCC)    Dr Shelle Iron     Family History  Problem Relation Age of Onset   Hypertension Mother    Hyperlipidemia Mother    Hyperlipidemia Father    Hypertension Father    Prostate cancer Father    Kidney failure Father    Sarcoidosis Neg Hx    Rheumatologic disease Neg Hx    Lung disease Neg Hx     Social History   Socioeconomic History   Marital status: Married    Spouse name: Not on file   Number of children: Not on file   Years of education: Not on file   Highest education level: Not on file  Occupational History   Not on file  Tobacco Use   Smoking status: Never   Smokeless tobacco: Never  Substance and Sexual Activity   Alcohol use: Yes    Alcohol/week: 0.0 standard drinks    Comment: Occasional glass of wine.   Drug use: No   Sexual activity: Not on file  Other Topics Concern   Not on file  Social History Narrative   Household of three married   Non-smoker regular exercise   [facility] 40 hours plus   Self-employed carpet cleaning.   No pets    Son at [employer] graduated Another teen at home.   Soc etoh  caffiene tea some soda minimal   Exercises       [facility]:   Originally from [place]. Always lived in [place]. Prior travel to [place], [place], [place], [place], [place], & [place]. He feels he has been to pretty  much the entire continental [place] state. No international travel. Currently works cleaning carpets. No pets currently. No bid exposure. No mold or hot tub exposure.    Social Determinants of Health   Financial Resource Strain: Not on file  Food Insecurity: Not on file  Transportation Needs: Not on file  Physical Activity: Not on file  Stress: Not on file  Social Connections: Not on file    Outpatient Medications Prior to Visit  Medication Sig Dispense Refill   clobetasol ointment (TEMOVATE) 0.05 %      KLOR-CON M20 20 MEQ tablet TAKE 1 TABLET BY MOUTH EVERY DAY 30 tablet 0   simvastatin (ZOCOR) 40 MG tablet TAKE 1 TABLET BY MOUTH EVERYDAY AT BEDTIME 30 tablet 0   triamterene-hydrochlorothiazide (MAXZIDE-25) 37.5-25 MG tablet TAKE 1 TABLET BY MOUTH EVERY DAY 30 tablet 0   No facility-administered medications prior to visit.     EXAM:  BP 124/70 (BP Location: Left Arm, Patient Position: Sitting, Cuff Size: Normal)    Pulse 77    Temp 98.7 F (37.1 C) (Oral)    Ht 5\' 6"  (1.676 m)    Wt 160 lb (72.6  kg)    SpO2 99%    BMI 25.82 kg/m   Body mass index is 25.82 kg/m.  GENERAL: vitals reviewed and listed above, alert, oriented, appears well hydrated and in no acute distress HEENT: atraumatic, conjunctiva  clear, no obvious abnormalities on inspection of external nose and ears NECK: no obvious masses on inspection thyroid is palpable asymmetric left more than right PSYCH: pleasant and cooperative, no obvious depression or anxiety Lab Results  Component Value Date   WBC 4.4 05/29/2021   HGB 14.8 05/29/2021   HCT 44.2 05/29/2021   PLT 271.0 05/29/2021   GLUCOSE 94 05/29/2021   CHOL 161 05/29/2021   TRIG 57.0 05/29/2021   HDL 49.70 05/29/2021   LDLDIRECT 203.0 04/22/2007   LDLCALC 100 (H) 05/29/2021   ALT 32 05/29/2021   AST 26 05/29/2021   NA 141 05/29/2021   K 4.3 05/29/2021   CL 102 05/29/2021   CREATININE 1.14 05/29/2021   BUN 20 05/29/2021   CO2 29 05/29/2021   TSH 0.64  05/29/2021   PSA 0.71 05/29/2021   HGBA1C 6.1 05/29/2021   BP Readings from Last 3 Encounters:  07/25/21 124/70  05/29/21 118/78  03/15/21 120/72  Reviewed CT scan report and thyroid ultrasound with patient and wife.   ASSESSMENT AND PLAN:  Discussed the following assessment and plan:  Thyroid nodule incidentally noted on imaging study left  - nl tsh dec 22 - Plan: [place] FNA BX THYROID 1ST LESION AFIRMA, Ambulatory referral to Endocrinology  Multiple thyroid nodules - Plan: [place] FNA BX THYROID 1ST LESION AFIRMA, Ambulatory referral to Endocrinology Discussed proceeding we will refer for fine-needle biopsy and endocrinology referral.  CT imaging reported as atherosclerosis but no idea of severity has no symptoms and he is on simvastatin 40 mg.  At some point we consider intensifying treatment to get LDL closer to 70 but he is really quite healthy and other factors are controlled to remain on simvastatin at this time  -Patient advised to return or notify health care team  if  new concerns arise.  Patient Instructions  Good seeing you today  Will order a  FNA  and endocrine referral for the thyroid nodules.  Stay on  cholesterol  lowering medication and Continue lifestyle intervention healthy eating and exercise .     Neta Mends. Lakeisa Heninger M.D.

## 2021-07-31 ENCOUNTER — Other Ambulatory Visit: Payer: Self-pay | Admitting: Internal Medicine

## 2021-08-20 ENCOUNTER — Other Ambulatory Visit: Payer: Self-pay | Admitting: Internal Medicine

## 2021-08-22 NOTE — Telephone Encounter (Signed)
Ok to refill x 6 months worth 

## 2021-08-22 NOTE — Telephone Encounter (Signed)
Last Ov 07/25/20 ?Filled 07/19/21 ?Is it ok to refill? ?

## 2021-08-23 ENCOUNTER — Telehealth: Payer: Self-pay | Admitting: Internal Medicine

## 2021-08-23 NOTE — Telephone Encounter (Signed)
Patient was informed of message and verbalized understanding,  patient stated he has a friend that had surgery and alarmed him.  No other concerns  ?

## 2021-08-23 NOTE — Telephone Encounter (Signed)
Contact patient and ask him what is questions are ?His last PSA level in December was low and normal ? ?0.71 all have been below 1. ?

## 2021-08-23 NOTE — Telephone Encounter (Signed)
Last Ov 07/25/21 ?Please advise ?

## 2021-08-23 NOTE — Telephone Encounter (Signed)
Patient called in requesting to speak with someone regarding getting his prostate checked. ? ?Please advise. ?

## 2021-08-24 ENCOUNTER — Other Ambulatory Visit (HOSPITAL_COMMUNITY)
Admission: RE | Admit: 2021-08-24 | Discharge: 2021-08-24 | Disposition: A | Discharge status: Home / Self Care | Payer: BC Managed Care – PPO | Source: Ambulatory Visit | Attending: Internal Medicine | Admitting: Internal Medicine

## 2021-08-24 ENCOUNTER — Ambulatory Visit
Admission: RE | Admit: 2021-08-24 | Discharge: 2021-08-24 | Disposition: A | Discharge status: Home / Self Care | Payer: BC Managed Care – PPO | Source: Ambulatory Visit | Attending: Internal Medicine | Admitting: Internal Medicine

## 2021-08-24 ENCOUNTER — Other Ambulatory Visit: Payer: Self-pay

## 2021-08-24 DIAGNOSIS — E042 Nontoxic multinodular goiter: Secondary | ICD-10-CM | POA: Insufficient documentation

## 2021-08-25 LAB — CYTOLOGY - NON PAP

## 2021-08-27 NOTE — Progress Notes (Signed)
Good news    bx consistent with benign findings

## 2021-08-28 ENCOUNTER — Telehealth: Payer: Self-pay | Admitting: Internal Medicine

## 2021-08-28 NOTE — Telephone Encounter (Signed)
Patient informed of results verbalized understanding and wants to thank Dr. Regis Bill for the good news ?

## 2021-08-28 NOTE — Telephone Encounter (Signed)
Patient is requesting a call back.  He recived lab results on MyChart but doesn't understand them. ?

## 2021-11-04 ENCOUNTER — Other Ambulatory Visit: Payer: Self-pay | Admitting: Internal Medicine

## 2021-11-05 ENCOUNTER — Other Ambulatory Visit: Payer: Self-pay | Admitting: Internal Medicine

## 2021-11-14 LAB — HM COLONOSCOPY

## 2021-12-13 ENCOUNTER — Encounter: Payer: Self-pay | Admitting: Internal Medicine

## 2022-02-04 ENCOUNTER — Other Ambulatory Visit: Payer: Self-pay | Admitting: Internal Medicine

## 2022-02-05 ENCOUNTER — Other Ambulatory Visit: Payer: Self-pay | Admitting: Internal Medicine

## 2022-02-22 ENCOUNTER — Other Ambulatory Visit: Payer: Self-pay | Admitting: Internal Medicine

## 2022-04-17 ENCOUNTER — Encounter: Payer: Self-pay | Admitting: Family Medicine

## 2022-04-17 ENCOUNTER — Ambulatory Visit: Payer: BC Managed Care – PPO | Admitting: Family Medicine

## 2022-04-17 VITALS — BP 138/86 | HR 74 | Temp 98.0°F | Wt 162.2 lb

## 2022-04-17 DIAGNOSIS — J40 Bronchitis, not specified as acute or chronic: Secondary | ICD-10-CM | POA: Diagnosis not present

## 2022-04-17 DIAGNOSIS — R059 Cough, unspecified: Secondary | ICD-10-CM

## 2022-04-17 LAB — POC COVID19 BINAXNOW: SARS Coronavirus 2 Ag: NEGATIVE

## 2022-04-17 LAB — POCT INFLUENZA A/B
Influenza A, POC: NEGATIVE
Influenza B, POC: NEGATIVE

## 2022-04-17 MED ORDER — AZITHROMYCIN 250 MG PO TABS: ORAL_TABLET | ORAL | 0 refills | Status: DC

## 2022-04-17 NOTE — Addendum Note (Signed)
Addended by: Wyvonne Lenz on: 04/17/2022 01:53 PM   Modules accepted: Orders

## 2022-04-17 NOTE — Progress Notes (Signed)
   Subjective:    Patient ID: Tyler Barrera, male    DOB: Feb 17, 1961, 61 y.o.   MRN: [id number]  HPI Here for one week of coughing up yellow sputum. No fever or SOB. Using Robitussin.    Review of Systems  Constitutional: Negative.   HENT: Negative.    Eyes: Negative.   Respiratory:  Positive for cough. Negative for shortness of breath and wheezing.   Cardiovascular: Negative.   Gastrointestinal: Negative.        Objective:   Physical Exam Constitutional:      Appearance: Normal appearance. He is not ill-appearing.  HENT:     Right Ear: Tympanic membrane, ear canal and external ear normal.     Left Ear: Tympanic membrane, ear canal and external ear normal.     Nose: Nose normal.     Mouth/Throat:     Pharynx: Oropharynx is clear.  Eyes:     Conjunctiva/sclera: Conjunctivae normal.  Pulmonary:     Effort: Pulmonary effort is normal.     Breath sounds: Wheezing present. No rhonchi or rales.  Lymphadenopathy:     Cervical: No cervical adenopathy.  Neurological:     Mental Status: He is alert.           Assessment & Plan:  Bronchitis, treat with a Zpack. Alysia Penna, MD

## 2022-05-04 ENCOUNTER — Other Ambulatory Visit: Payer: Self-pay | Admitting: Internal Medicine

## 2022-05-08 ENCOUNTER — Other Ambulatory Visit: Payer: Self-pay | Admitting: Internal Medicine

## 2022-05-25 ENCOUNTER — Other Ambulatory Visit: Payer: Self-pay | Admitting: Internal Medicine

## 2022-08-01 NOTE — Progress Notes (Signed)
Chief Complaint  Patient presents with   Annual Exam    HPI: Patient  Tyler Barrera  62 y.o. comes in today for Preventive Health Care visit  and med cdm  Sarcoid:    stable had stable ct sca last year no sx  BP: has bden on on medicationmaxide and potassium HLD on simvastatin  nosx  Thyroid bx  march 23 benign follicular nodule  large and other nodule to be followed by [place] for 5 years as per  rad protocol  Right arm shoulder hand weeks of discoomfort  some tingling radial hand no weaknss injury neck pain   Health Maintenance  Topic Date Due   Diabetic kidney evaluation - Urine ACR  Never done   DTaP/Tdap/Td (1 - Tdap) Never done   COVID-19 Vaccine (5 - 2023-24 season) 10/31/2022 (Originally 04/15/2022)   Zoster Vaccines- Shingrix (1 of 2) 10/31/2022 (Originally 03/18/1980)   HEMOGLOBIN A1C  01/31/2023   Diabetic kidney evaluation - eGFR measurement  08/03/2023   COLONOSCOPY (Pts 45-27yrs Insurance coverage will need to be confirmed)  11/15/2031   INFLUENZA VACCINE  Completed   Hepatitis C Screening  Completed   HIV Screening  Completed   HPV VACCINES  Aged Out   Health Maintenance Review LIFESTYLE:  Exercise:  active Tobacco/ETS:n Alcohol: n Sugar beverages:ocass pepsi Sleep:ok not enough Drug use: no HH of 2  Work: ft active but no injury     ROS:  GEN/ HEENT: No fever, significant weight changes sweats headaches vision problems hearing changes, CV/ PULM; No chest pain shortness of breath cough, syncope,edema  change in exercise tolerance. GI /GU: No adominal pain, vomiting, change in bowel habits. No blood in the stool. No significant GU symptoms. SKIN/HEME: ,no acute skin rashes suspicious lesions or bleeding. No lymphadenopathy, nodules, masses.  NEURO/ PSYCH:  No depression anxiety. IMM/ Allergy: No unusual infections.  Allergy .   REST of 12 system review negative except as per HPI   Past Medical History:  Diagnosis Date   Hyperlipidemia    Hypertension     Pulmonary sarcoidosis (HCC)    Dr Shelle Iron     Past Surgical History:  Procedure Laterality Date   BRONCHOSCOPY      Family History  Problem Relation Age of Onset   Hypertension Mother    Hyperlipidemia Mother    Hyperlipidemia Father    Hypertension Father    Prostate cancer Father    Kidney failure Father    Sarcoidosis Neg Hx    Rheumatologic disease Neg Hx    Lung disease Neg Hx     Social History   Socioeconomic History   Marital status: Married    Spouse name: Not on file   Number of children: Not on file   Years of education: Not on file   Highest education level: Not on file  Occupational History   Not on file  Tobacco Use   Smoking status: Never   Smokeless tobacco: Never  Substance and Sexual Activity   Alcohol use: Yes    Alcohol/week: 0.0 standard drinks of alcohol    Comment: Occasional glass of wine.   Drug use: No   Sexual activity: Not on file  Other Topics Concern   Not on file  Social History Narrative   Household of three married   Non-smoker regular exercise   [facility] 40 hours plus   Self-employed carpet cleaning.   No pets    Son at [employer] graduated Another  teen at home.   Soc etoh  caffiene tea some soda minimal   Exercises       [facility]:   Originally from [place]. Always lived in [place]. Prior travel to [place], [place], [place], [place], [place], & [place]. He feels he has been to pretty much the entire continental [place] state. No international travel. Currently works cleaning carpets. No pets currently. No bid exposure. No mold or hot tub exposure.    Social Determinants of Health   Financial Resource Strain: Not on file  Food Insecurity: Not on file  Transportation Needs: Not on file  Physical Activity: Not on file  Stress: Not on file  Social Connections: Not on file    Outpatient Medications Prior to Visit  Medication Sig Dispense Refill   KLOR-CON M20 20 MEQ tablet TAKE 1 TABLET BY MOUTH EVERY DAY 90 tablet 0   simvastatin  (ZOCOR) 40 MG tablet TAKE 1 TABLET BY MOUTH EVERYDAY AT BEDTIME 90 tablet 0   triamterene-hydrochlorothiazide (MAXZIDE-25) 37.5-25 MG tablet TAKE 1 TABLET BY MOUTH EVERY DAY 90 tablet 0   azithromycin (ZITHROMAX Z-PAK) 250 MG tablet As directed 6 each 0   clobetasol ointment (TEMOVATE) 0.05 %      No facility-administered medications prior to visit.     EXAM:  BP 128/82 (BP Location: Right Arm, Patient Position: Sitting, Cuff Size: Large)   Pulse 71   Temp 97.7 F (36.5 C) (Oral)   Ht 5' 5.5" (1.664 m)   Wt 162 lb (73.5 kg)   SpO2 97%   BMI 26.55 kg/m   Body mass index is 26.55 kg/m. Wt Readings from Last 3 Encounters:  08/02/22 162 lb (73.5 kg)  04/17/22 162 lb 4 oz (73.6 kg)  07/25/21 160 lb (72.6 kg)    Physical Exam: Vital signs reviewed ZOX:WRUE is a well-developed well-nourished alert cooperative    who appearsr stated age in no acute distress.  HEENT: normocephalic atraumatic , Eyes: PERRL EOM's full, conjunctiva clear, Nares: paten,t no deformity discharge or tenderness., Ears: no deformity EAC's clear TMs with normal landmarks. Mouth: clear OP, no lesions, edema.  Moist mucous membranes. Dentition in adequate repair. NECK: supple enlarge left side  soft thyromegaly no bruits. CHEST/PULM:  Clear to auscultation and percussion breath sounds equal no wheeze , rales or rhonchi. No chest wall deformities or tenderness. . CV: PMI is nondisplaced, S1 S2 no gallops, murmurs, rubs. Peripheral pulses are full without delay.No JVD .  ABDOMEN: Bowel sounds normal nontender  No guard or rebound, no hepato splenomegal no CVA tenderness.   Extremtities:  No clubbing cyanosis or edema, no acute joint swelling or redness no focal atrophy NEURO:  Oriented x3, cranial nerves 3-12 appear to be intact, no obvious focal weakness,gait within normal limits no abnormal reflexes or asymmetrical SKIN: No acute rashes normal turgor, color, no bruising or petechiae. PSYCH: Oriented, good eye  contact, no obvious depression anxiety, cognition and judgment appear normal. LN: no cervical axillary inguinal adenopathy  Lab Results  Component Value Date   WBC 3.8 (L) 08/02/2022   HGB 15.0 08/02/2022   HCT 44.6 08/02/2022   PLT 241.0 08/02/2022   GLUCOSE 81 08/02/2022   CHOL 158 08/02/2022   TRIG 55.0 08/02/2022   HDL 51.30 08/02/2022   LDLDIRECT 203.0 04/22/2007   LDLCALC 96 08/02/2022   ALT 26 08/02/2022   AST 24 08/02/2022   NA 142 08/02/2022   K 3.9 08/02/2022   CL 103 08/02/2022   CREATININE 1.11 08/02/2022  BUN 17 08/02/2022   CO2 30 08/02/2022   TSH 0.56 08/02/2022   PSA 1.02 08/02/2022   HGBA1C 6.0 08/02/2022    BP Readings from Last 3 Encounters:  08/02/22 128/82  04/17/22 138/86  07/25/21 124/70    Lab plan reviewed with patient   ASSESSMENT AND PLAN:  Discussed the following assessment and plan:    ICD-10-CM   1. Visit for preventive health examination  Z00.00 Basic metabolic panel    CBC with Differential/Platelet    Hemoglobin A1c    Hepatic function panel    Lipid panel    TSH    T4, free    PSA    2. Multiple thyroid nodules  E04.2 Basic metabolic panel    CBC with Differential/Platelet    Hemoglobin A1c    Hepatic function panel    Lipid panel    TSH    T4, free    US THYROID    3. Hyperglycemia  R73.9 Basic metabolic panel    CBC with Differential/Platelet    Hemoglobin A1c    Hepatic function panel    Lipid panel    TSH    T4, free    4. Medication management  Z79.899 Basic metabolic panel    CBC with Differential/Platelet    Hemoglobin A1c    Hepatic function panel    Lipid panel    TSH    T4, free    5. Screening PSA (prostate specific antigen)  Z12.5 PSA    6. PULMONARY SARCOIDOSIS  D86.9 Basic metabolic panel    CBC with Differential/Platelet    Hemoglobin A1c    Hepatic function panel    Lipid panel    TSH    T4, free    7. Hyperlipidemia, unspecified hyperlipidemia type  E78.5 Basic metabolic panel     CBC with Differential/Platelet    Hemoglobin A1c    Hepatic function panel    Lipid panel    TSH    T4, free    8. Essential hypertension  I10 Basic metabolic panel    CBC with Differential/Platelet    Hemoglobin A1c    Hepatic function panel    Lipid panel    TSH    T4, free    Lab monitoring update thyroid US consider endo referral based on size of nodules neg initial bx on large nodule  Bp and hld control If r arm sx progress then reevaluation seems like shoulder but has radial hand tingling  could be radicular .  Return in about 1 year (around 08/03/2023) for depending on results.  Patient Care Team: Madelin Headings, MD as PCP - General Rachael Fee, MD (Gastroenterology) Roslynn Amble, MD (Inactive) as Consulting Physician (Pulmonary Disease) Patient Instructions  Good to see you today .  Lab today   If right arm sx progress   persistent or progressive then  plan fu or  further assessment.  You will be contacted about updated thyroid ultrasound and plan   Neta Mends. Jeb Schloemer M.D.

## 2022-08-02 ENCOUNTER — Ambulatory Visit (INDEPENDENT_AMBULATORY_CARE_PROVIDER_SITE_OTHER): Payer: BC Managed Care – PPO | Admitting: Internal Medicine

## 2022-08-02 ENCOUNTER — Encounter: Payer: Self-pay | Admitting: Internal Medicine

## 2022-08-02 VITALS — BP 128/82 | HR 71 | Temp 97.7°F | Ht 65.5 in | Wt 162.0 lb

## 2022-08-02 DIAGNOSIS — E042 Nontoxic multinodular goiter: Secondary | ICD-10-CM | POA: Diagnosis not present

## 2022-08-02 DIAGNOSIS — Z0000 Encounter for general adult medical examination without abnormal findings: Secondary | ICD-10-CM

## 2022-08-02 DIAGNOSIS — E785 Hyperlipidemia, unspecified: Secondary | ICD-10-CM

## 2022-08-02 DIAGNOSIS — Z79899 Other long term (current) drug therapy: Secondary | ICD-10-CM

## 2022-08-02 DIAGNOSIS — R739 Hyperglycemia, unspecified: Secondary | ICD-10-CM

## 2022-08-02 DIAGNOSIS — D869 Sarcoidosis, unspecified: Secondary | ICD-10-CM | POA: Diagnosis not present

## 2022-08-02 DIAGNOSIS — Z125 Encounter for screening for malignant neoplasm of prostate: Secondary | ICD-10-CM

## 2022-08-02 DIAGNOSIS — I10 Essential (primary) hypertension: Secondary | ICD-10-CM

## 2022-08-02 LAB — BASIC METABOLIC PANEL
BUN: 17 mg/dL (ref 6–23)
CO2: 30 mEq/L (ref 19–32)
Calcium: 9.7 mg/dL (ref 8.4–10.5)
Chloride: 103 mEq/L (ref 96–112)
Creatinine, Ser: 1.11 mg/dL (ref 0.40–1.50)
GFR: 71.24 mL/min (ref >60.00)
Glucose, Bld: 81 mg/dL (ref 70–99)
Potassium: 3.9 mEq/L (ref 3.5–5.1)
Sodium: 142 mEq/L (ref 135–145)

## 2022-08-02 LAB — HEMOGLOBIN A1C: Hgb A1c MFr Bld: 6 % (ref 4.6–6.5)

## 2022-08-02 LAB — CBC WITH DIFFERENTIAL/PLATELET
Basophils Absolute: 0 10*3/uL (ref 0.0–0.1)
Basophils Relative: 0.6 % (ref 0.0–3.0)
Eosinophils Absolute: 0.1 10*3/uL (ref 0.0–0.7)
Eosinophils Relative: 2.5 % (ref 0.0–5.0)
HCT: 44.6 % (ref 39.0–52.0)
Hemoglobin: 15 g/dL (ref 13.0–17.0)
Lymphocytes Relative: 29.4 % (ref 12.0–46.0)
Lymphs Abs: 1.1 10*3/uL (ref 0.7–4.0)
MCHC: 33.7 g/dL (ref 30.0–36.0)
MCV: 83.4 fl (ref 78.0–100.0)
Monocytes Absolute: 0.5 10*3/uL (ref 0.1–1.0)
Monocytes Relative: 12.8 % — ABNORMAL HIGH (ref 3.0–12.0)
Neutro Abs: 2.1 10*3/uL (ref 1.4–7.7)
Neutrophils Relative %: 54.7 % (ref 43.0–77.0)
Platelets: 241 10*3/uL (ref 150.0–400.0)
RBC: 5.34 Mil/uL (ref 4.22–5.81)
RDW: 13.7 % (ref 11.5–15.5)
WBC: 3.8 10*3/uL — ABNORMAL LOW (ref 4.0–10.5)

## 2022-08-02 LAB — HEPATIC FUNCTION PANEL
ALT: 26 U/L (ref 0–53)
AST: 24 U/L (ref 0–37)
Albumin: 5 g/dL (ref 3.5–5.2)
Alkaline Phosphatase: 96 U/L (ref 39–117)
Bilirubin, Direct: 0.2 mg/dL (ref 0.0–0.3)
Total Bilirubin: 0.6 mg/dL (ref 0.2–1.2)
Total Protein: 7.8 g/dL (ref 6.0–8.3)

## 2022-08-02 LAB — LIPID PANEL
Cholesterol: 158 mg/dL (ref 0–200)
HDL: 51.3 mg/dL (ref >39.00)
LDL Cholesterol: 96 mg/dL (ref 0–99)
NonHDL: 107.02
Total CHOL/HDL Ratio: 3
Triglycerides: 55 mg/dL (ref 0.0–149.0)
VLDL: 11 mg/dL (ref 0.0–40.0)

## 2022-08-02 LAB — T4, FREE: Free T4: 0.82 ng/dL (ref 0.60–1.60)

## 2022-08-02 LAB — TSH: TSH: 0.56 u[IU]/mL (ref 0.35–5.50)

## 2022-08-02 LAB — PSA: PSA: 1.02 ng/mL (ref 0.10–4.00)

## 2022-08-02 NOTE — Progress Notes (Signed)
Blood sugar stable A1c is 6 ( diabetes is 6.5)  blood results thyroid psa normal range  no anemia wbc slightly low but similar as in past and not clinically concerning . No change in medications.

## 2022-08-02 NOTE — Patient Instructions (Addendum)
Good to see you today .  Lab today   If right arm sx progress   persistent or progressive then  plan fu or  further assessment.  You will be contacted about updated thyroid ultrasound and plan

## 2022-08-06 ENCOUNTER — Other Ambulatory Visit: Payer: Self-pay | Admitting: Internal Medicine

## 2022-08-06 ENCOUNTER — Telehealth: Payer: Self-pay | Admitting: Internal Medicine

## 2022-08-06 DIAGNOSIS — R739 Hyperglycemia, unspecified: Secondary | ICD-10-CM

## 2022-08-06 NOTE — Telephone Encounter (Signed)
Pt is returning sken[place]ah call concerning blood work results

## 2022-08-06 NOTE — Telephone Encounter (Signed)
Inform pt of his lab result. Verbalized understanding. Pt wants to know if he can come back in 6 months to recheck his A1c. Please advise.

## 2022-08-07 NOTE — Telephone Encounter (Signed)
Pt was notified. Lab order placed. Lab appt scheduled.

## 2022-08-13 ENCOUNTER — Other Ambulatory Visit: Payer: Self-pay | Admitting: Internal Medicine

## 2022-08-14 ENCOUNTER — Ambulatory Visit
Admission: RE | Admit: 2022-08-14 | Discharge: 2022-08-14 | Disposition: A | Discharge status: Home / Self Care | Payer: BC Managed Care – PPO | Source: Ambulatory Visit | Attending: Internal Medicine | Admitting: Internal Medicine

## 2022-08-14 DIAGNOSIS — E042 Nontoxic multinodular goiter: Secondary | ICD-10-CM

## 2022-08-15 NOTE — Progress Notes (Signed)
So US shows larger nodule is smaller ( shows benign character) other nodules stable  and nothing new . Advised [place] to do every 1-2 years for at least 5 years  . I am ok with holding off on referral to endocrinology since findings are  benign and positive . Advise repeat thyroid Ultrasound in a year . Please let us know if  you prefer to  refer  to endocrine as I  had discussed . To help follow . I am fine either way.

## 2022-08-23 ENCOUNTER — Other Ambulatory Visit: Payer: Self-pay | Admitting: Internal Medicine

## 2022-09-03 ENCOUNTER — Encounter: Payer: Self-pay | Admitting: Family Medicine

## 2022-09-03 ENCOUNTER — Ambulatory Visit (INDEPENDENT_AMBULATORY_CARE_PROVIDER_SITE_OTHER): Payer: BC Managed Care – PPO | Admitting: Family Medicine

## 2022-09-03 VITALS — BP 128/82 | HR 79 | Temp 98.2°F | Wt 162.2 lb

## 2022-09-03 DIAGNOSIS — J00 Acute nasopharyngitis [common cold]: Secondary | ICD-10-CM | POA: Diagnosis not present

## 2022-09-03 DIAGNOSIS — J019 Acute sinusitis, unspecified: Secondary | ICD-10-CM | POA: Diagnosis not present

## 2022-09-03 DIAGNOSIS — J3489 Other specified disorders of nose and nasal sinuses: Secondary | ICD-10-CM

## 2022-09-03 LAB — POC COVID19 BINAXNOW: SARS Coronavirus 2 Ag: NEGATIVE

## 2022-09-03 MED ORDER — AZITHROMYCIN 250 MG PO TABS: ORAL_TABLET | ORAL | 0 refills | Status: DC

## 2022-09-03 NOTE — Progress Notes (Signed)
   Subjective:    Patient ID: Tyler Barrera, male    DOB: 1960/10/19, 62 y.o.   MRN: [id number]  HPI Here for 5 days of sinus congestion, PND, and blowing yellow mucus from the nose. No fever or cough or ST.    Review of Systems  Constitutional: Negative.   HENT:  Positive for congestion, postnasal drip and sinus pressure. Negative for ear pain and sore throat.   Eyes: Negative.   Respiratory: Negative.         Objective:   Physical Exam Constitutional:      Appearance: Normal appearance.  HENT:     Right Ear: Tympanic membrane, ear canal and external ear normal.     Left Ear: Tympanic membrane, ear canal and external ear normal.     Nose: Nose normal.     Mouth/Throat:     Pharynx: Oropharynx is clear.  Eyes:     Conjunctiva/sclera: Conjunctivae normal.  Pulmonary:     Effort: Pulmonary effort is normal.     Breath sounds: Normal breath sounds.  Lymphadenopathy:     Cervical: No cervical adenopathy.  Neurological:     Mental Status: He is alert.           Assessment & Plan:  Sinusitis, treat with a Zpack.  Alysia Penna, MD

## 2022-09-18 ENCOUNTER — Other Ambulatory Visit: Payer: Self-pay | Admitting: Internal Medicine

## 2022-11-07 ENCOUNTER — Other Ambulatory Visit: Payer: Self-pay | Admitting: Family

## 2022-11-23 ENCOUNTER — Other Ambulatory Visit: Payer: Self-pay | Admitting: Family

## 2022-11-23 NOTE — Telephone Encounter (Signed)
Pt also requesting simvastatin (ZOCOR) 40 MG tablet  triamterene-hydrochlorothiazide (MAXZIDE-25) 37.5-25 MG tablet   (was unsure of the name of the medications, written by Hyman Hopes) [phone] [place], [place] - [address] AT [facility] [address] Phone: [phone]  Fax: [phone]

## 2022-11-26 ENCOUNTER — Other Ambulatory Visit: Payer: Self-pay

## 2022-11-26 ENCOUNTER — Telehealth: Payer: Self-pay

## 2022-11-26 MED ORDER — SIMVASTATIN 40 MG PO TABS: ORAL_TABLET | ORAL | 0 refills | Status: DC

## 2022-11-26 NOTE — Telephone Encounter (Signed)
Attempted to reach pt to inform simvastatin was sent in and maxzide still has a refill. Also to check on how he is doing. Left a voicemail to contact us back.

## 2022-11-26 NOTE — Telephone Encounter (Signed)
Pt calling to check on simvastatin (ZOCOR) 40 MG tablet  aware provider is OOO advised someone else may have to fill, says he was not feeling well over the weekend and has been waiting for this refill

## 2022-11-27 NOTE — Telephone Encounter (Signed)
Attempted to pt to follow up. Left a voicemail to call us back.   Spoke to pharmacy. They state pt is not due for his pick up on Maxzide until July as his last pick up date was on 4/11. He has one refill left. He just pick up his simvastatin yesterday.

## 2023-02-05 ENCOUNTER — Other Ambulatory Visit (INDEPENDENT_AMBULATORY_CARE_PROVIDER_SITE_OTHER): Payer: BC Managed Care – PPO

## 2023-02-05 DIAGNOSIS — R739 Hyperglycemia, unspecified: Secondary | ICD-10-CM

## 2023-02-05 LAB — HEMOGLOBIN A1C: Hgb A1c MFr Bld: 5.9 % (ref 4.6–6.5)

## 2023-02-17 ENCOUNTER — Other Ambulatory Visit: Payer: Self-pay | Admitting: Family

## 2023-02-20 ENCOUNTER — Other Ambulatory Visit: Payer: Self-pay | Admitting: Internal Medicine

## 2023-03-16 ENCOUNTER — Other Ambulatory Visit: Payer: Self-pay | Admitting: Family

## 2023-05-29 ENCOUNTER — Other Ambulatory Visit: Payer: Self-pay | Admitting: Family

## 2023-05-29 NOTE — Telephone Encounter (Signed)
Copied from CRM [id number]. Topic: Clinical - Medication Refill >> May 29, 2023 10:24 AM Desma Mcgregor wrote: Most Recent Primary Care Visit:  Provider: [facility]  Department: [facility]  Visit Type: LAB  Date: 02/05/2023  Medication: ***  Has the patient contacted their pharmacy?  (Agent: If no, request that the patient contact the pharmacy for the refill. If patient does not wish to contact the pharmacy document the reason why and proceed with request.) (Agent: If yes, when and what did the pharmacy advise?)  Is this the correct pharmacy for this prescription?  If no, delete pharmacy and type the correct one.  This is the patient's preferred pharmacy:  [facility] [facility] [address] - [place], [place] - [address] [facility] [facility] [address] [facility] [address] [facility] [place] [address] Phone: [phone] Fax: [phone]  [id number] [place], [place] - [address] AT [address] [place] [address] Phone: [phone] Fax: [phone]   Has the prescription been filled recently?   Is the patient out of the medication?   Has the patient been seen for an appointment in the last year OR does the patient have an upcoming appointment?   Can we respond through MyChart?   Agent: Please be advised that Rx refills may take up to 3 business days. We ask that you follow-up with your pharmacy.

## 2023-05-30 ENCOUNTER — Other Ambulatory Visit: Payer: Self-pay

## 2023-05-30 ENCOUNTER — Telehealth: Payer: Self-pay | Admitting: Internal Medicine

## 2023-05-30 MED ORDER — TRIAMTERENE-HCTZ 37.5-25 MG PO TABS: ORAL_TABLET | ORAL | 1 refills | Status: DC

## 2023-05-30 MED ORDER — SIMVASTATIN 40 MG PO TABS: ORAL_TABLET | ORAL | 0 refills | Status: DC

## 2023-05-30 MED ORDER — POTASSIUM CHLORIDE CRYS ER 20 MEQ PO TBCR: 20.0000 meq | EXTENDED_RELEASE_TABLET | Freq: Every day | ORAL | 0 refills | Status: DC

## 2023-05-30 NOTE — Telephone Encounter (Signed)
Rx were sent.   Contacted pharmacy and spoke to [place] to follow up. They  reports they did received it.   Pt was in office today to make sure Rx was sent in. Inform FD, Tammy, to let pt know that it was sent.

## 2023-05-30 NOTE — Telephone Encounter (Signed)
Pt Needs a 90-day refill on Simvastatin 40 mg, Klor-Con M20 and Triamterene-hydrochlorothiazide 37.5-25 mg.  He has been trying to get this fill all week, however the pharmacy has been directing it to Dr. Clent Ridges so now he is out.  He will need these refills ASAP.  [facility] [address]Marland Kitchen

## 2023-07-18 ENCOUNTER — Encounter: Payer: Self-pay | Admitting: Family Medicine

## 2023-07-18 ENCOUNTER — Ambulatory Visit (INDEPENDENT_AMBULATORY_CARE_PROVIDER_SITE_OTHER): Payer: 59 | Admitting: Family Medicine

## 2023-07-18 VITALS — BP 110/62 | HR 91 | Temp 100.1°F | Wt 160.4 lb

## 2023-07-18 DIAGNOSIS — R509 Fever, unspecified: Secondary | ICD-10-CM

## 2023-07-18 DIAGNOSIS — U071 COVID-19: Secondary | ICD-10-CM

## 2023-07-18 LAB — POCT INFLUENZA A/B
Influenza A, POC: NEGATIVE
Influenza B, POC: NEGATIVE

## 2023-07-18 LAB — POC COVID19 BINAXNOW: SARS Coronavirus 2 Ag: POSITIVE — AB

## 2023-07-18 LAB — POCT RAPID STREP A (OFFICE): Rapid Strep A Screen: NEGATIVE

## 2023-07-18 MED ORDER — NIRMATRELVIR/RITONAVIR (PAXLOVID)TABLET: 3.0000 | ORAL_TABLET | Freq: Two times a day (BID) | ORAL | 0 refills | Status: AC

## 2023-07-18 NOTE — Addendum Note (Signed)
 Addended by: Philbert Brave on: 07/18/2023 11:46 AM   Modules accepted: Orders

## 2023-07-18 NOTE — Progress Notes (Signed)
   Subjective:    Patient ID: Tyler Barrera, male    DOB: [birth year], 63 y.o.   MRN: [id number]  HPI Here for 3 days of ST, headache and a dry cough. He developed a fever last night. No SOB. Using Robitussin.    Review of Systems  Constitutional:  Positive for fever.  HENT:  Positive for congestion and sore throat. Negative for ear pain, postnasal drip and sinus pain.   Eyes: Negative.   Respiratory:  Positive for cough. Negative for shortness of breath and wheezing.   Gastrointestinal: Negative.        Objective:   Physical Exam Constitutional:      Appearance: Normal appearance.  HENT:     Right Ear: Tympanic membrane, ear canal and external ear normal.     Left Ear: Tympanic membrane, ear canal and external ear normal.     Nose: Nose normal.     Mouth/Throat:     Pharynx: Oropharynx is clear.  Eyes:     Conjunctiva/sclera: Conjunctivae normal.  Pulmonary:     Effort: Pulmonary effort is normal.     Breath sounds: Normal breath sounds.  Lymphadenopathy:     Cervical: No cervical adenopathy.  Neurological:     Mental Status: He is alert.           Assessment & Plan:  Covid infection. Treat with 5 days of Tamiflu.  Garnette Olmsted, MD

## 2023-08-24 ENCOUNTER — Other Ambulatory Visit: Payer: Self-pay | Admitting: Internal Medicine

## 2023-11-20 ENCOUNTER — Other Ambulatory Visit: Payer: Self-pay | Admitting: Family

## 2023-11-22 NOTE — Telephone Encounter (Signed)
 Patient called back to follow-up as well. He mentioned that he is on his last pill today.

## 2023-12-17 ENCOUNTER — Other Ambulatory Visit: Payer: Self-pay | Admitting: Internal Medicine

## 2024-01-08 ENCOUNTER — Encounter: Admitting: Internal Medicine

## 2024-02-19 ENCOUNTER — Encounter: Payer: Self-pay | Admitting: Internal Medicine

## 2024-02-19 ENCOUNTER — Ambulatory Visit (INDEPENDENT_AMBULATORY_CARE_PROVIDER_SITE_OTHER): Admitting: Internal Medicine

## 2024-02-19 ENCOUNTER — Ambulatory Visit: Payer: Self-pay | Admitting: Internal Medicine

## 2024-02-19 VITALS — BP 140/90 | HR 67 | Temp 97.8°F | Ht 66.0 in | Wt 156.2 lb

## 2024-02-19 DIAGNOSIS — E042 Nontoxic multinodular goiter: Secondary | ICD-10-CM

## 2024-02-19 DIAGNOSIS — Z79899 Other long term (current) drug therapy: Secondary | ICD-10-CM

## 2024-02-19 DIAGNOSIS — D869 Sarcoidosis, unspecified: Secondary | ICD-10-CM | POA: Diagnosis not present

## 2024-02-19 DIAGNOSIS — L293 Anogenital pruritus, unspecified: Secondary | ICD-10-CM

## 2024-02-19 DIAGNOSIS — E785 Hyperlipidemia, unspecified: Secondary | ICD-10-CM

## 2024-02-19 DIAGNOSIS — I10 Essential (primary) hypertension: Secondary | ICD-10-CM | POA: Diagnosis not present

## 2024-02-19 DIAGNOSIS — Z125 Encounter for screening for malignant neoplasm of prostate: Secondary | ICD-10-CM | POA: Diagnosis not present

## 2024-02-19 DIAGNOSIS — Z0000 Encounter for general adult medical examination without abnormal findings: Secondary | ICD-10-CM | POA: Diagnosis not present

## 2024-02-19 LAB — COMPREHENSIVE METABOLIC PANEL WITH GFR
ALT: 29 U/L (ref 0–53)
AST: 25 U/L (ref 0–37)
Albumin: 5 g/dL (ref 3.5–5.2)
Alkaline Phosphatase: 88 U/L (ref 39–117)
BUN: 12 mg/dL (ref 6–23)
CO2: 30 meq/L (ref 19–32)
Calcium: 9.5 mg/dL (ref 8.4–10.5)
Chloride: 101 meq/L (ref 96–112)
Creatinine, Ser: 1.07 mg/dL (ref 0.40–1.50)
GFR: 73.64 mL/min (ref >60.00)
Glucose, Bld: 85 mg/dL (ref 70–99)
Potassium: 4.1 meq/L (ref 3.5–5.1)
Sodium: 138 meq/L (ref 135–145)
Total Bilirubin: 1.1 mg/dL (ref 0.2–1.2)
Total Protein: 8 g/dL (ref 6.0–8.3)

## 2024-02-19 LAB — TSH: TSH: 0.83 u[IU]/mL (ref 0.35–5.50)

## 2024-02-19 LAB — LIPID PANEL
Cholesterol: 163 mg/dL (ref 0–200)
HDL: 52.7 mg/dL (ref >39.00)
LDL Cholesterol: 96 mg/dL (ref 0–99)
NonHDL: 110.05
Total CHOL/HDL Ratio: 3
Triglycerides: 69 mg/dL (ref 0.0–149.0)
VLDL: 13.8 mg/dL (ref 0.0–40.0)

## 2024-02-19 LAB — PSA: PSA: 0.92 ng/mL (ref 0.10–4.00)

## 2024-02-19 LAB — HEMOGLOBIN A1C: Hgb A1c MFr Bld: 6.3 % (ref 4.6–6.5)

## 2024-02-19 MED ORDER — POTASSIUM CHLORIDE CRYS ER 20 MEQ PO TBCR: 20.0000 meq | EXTENDED_RELEASE_TABLET | Freq: Every day | ORAL | 1 refills | Status: AC

## 2024-02-19 MED ORDER — TRIAMTERENE-HCTZ 37.5-25 MG PO TABS: 1.0000 | ORAL_TABLET | Freq: Every day | ORAL | 1 refills | Status: AC

## 2024-02-19 MED ORDER — SIMVASTATIN 40 MG PO TABS: ORAL_TABLET | ORAL | 3 refills | Status: AC

## 2024-02-19 NOTE — Patient Instructions (Signed)
 Check Bp readings and send in  goal average 130/80 and below   We can add medication to help control  Call pulmonary team last seen dr Theophilus  last ct was 2023 .  Will order thyroid  ultrasound for fu thyroid  nodule .  You can  adjust timing as per your schedule   Lab today  If all ok then yearly check but we may want to fu BP earlier if not at goal .

## 2024-02-19 NOTE — Progress Notes (Signed)
 Chief Complaint  Patient presents with   Annual Exam    Pt reports itchy on groin area sometimes. Would like some cream. Pt is aware concerns outside of physical, insurance doesn't cover.     HPI: Patient  Tyler Barrera  63 y.o. comes in today for Preventive Health Care visit  And Chronic disease management  HT: takes med daily  thinks bp may be up from poor sleep last pm  will begin to check readings  is fasting today  HLD no se of meds needs refill  No sx  .  Sarcoid   last check a few years ago no s  not sure of fu  had ct in 2023  Thyroid  nodule: had us  202 a3-4nd advised to have yearly us  x 5 years to ensure stability .  Overdue  Itchy  perineal  area at night without rash using vaseline and laundry attention  different underwear still an issue  physical work  no GU sx    Health Maintenance  Topic Date Due   Diabetic kidney evaluation - Urine ACR  Never done   Diabetic kidney evaluation - eGFR measurement  08/03/2023   HEMOGLOBIN A1C  08/08/2023   COVID-19 Vaccine (5 - 2025-26 season) 03/06/2024 (Originally 02/10/2024)   Zoster Vaccines- Shingrix (1 of 2) 05/20/2024 (Originally [birth year]/18/1980)   Influenza Vaccine  09/08/2024 (Originally 01/10/2024)   DTaP/Tdap/Td (1 - Tdap) 02/18/2025 (Originally 03/18/1979)   Pneumococcal Vaccine: 50+ Years (1 of 2 - PCV) 02/18/2025 (Originally 03/18/1979)   Colonoscopy  11/15/2031   Hepatitis C Screening  Completed   HIV Screening  Completed   Hepatitis B Vaccines 19-59 Average Risk  Aged Out   HPV VACCINES  Aged Out   Meningococcal B Vaccine  Aged Out   Health Maintenance Review LIFESTYLE:  Exercise:   physical work  Tobacco/ETS: n Alcohol:rare Sugar beverages: Sleep: Drug use: no HH of   1.5  no pets  Work: man hours     ROS:  GEN/ HEENT: No fever, significant weight changes sweats headaches vision problems hearing changes, CV/ PULM; No chest pain shortness of breath cough, syncope,edema  change in exercise tolerance. GI /GU: No  adominal pain, vomiting, change in bowel habits. No blood in the stool. No significant GU symptoms. SKIN/HEME: ,no acute skin rashes suspicious lesions or bleeding. No lymphadenopathy, nodules, masses.  NEURO/ PSYCH:  No neurologic signs such as weakness numbness. No depression anxiety. IMM/ Allergy: No unusual infections.  Allergy .   REST of 12 system review negative except as per HPI   Past Medical History:  Diagnosis Date   Hyperlipidemia    Hypertension    Pulmonary sarcoidosis (HCC)    Dr Corrie     Past Surgical History:  Procedure Laterality Date   BRONCHOSCOPY      Family History  Problem Relation Age of Onset   Hypertension Mother    Hyperlipidemia Mother    Hyperlipidemia Father    Hypertension Father    Prostate cancer Father    Kidney failure Father    Sarcoidosis Neg Hx    Rheumatologic disease Neg Hx    Lung disease Neg Hx     Social History   Socioeconomic History   Marital status: Married    Spouse name: Not on file   Number of children: Not on file   Years of education: Not on file   Highest education level: Not on file  Occupational History   Not on file  Tobacco Use  Smoking status: Never   Smokeless tobacco: Never  Substance and Sexual Activity   Alcohol use: Yes    Alcohol/week: 0.0 standard drinks of alcohol    Comment: Occasional glass of wine.   Drug use: No   Sexual activity: Not on file  Other Topics Concern   Not on file  Social History Narrative   Household of three married   Non-smoker regular exercise   [facility] 40 hours plus   Self-employed carpet cleaning.   No pets    Son at [employer]  graduated Another teen at home.   Soc etoh  caffiene tea some soda minimal   Exercises       [facility]:   Originally from [place]. Always lived in [place]. Prior travel to [place], [place], [place], [place], [place], & [place]. He feels he has been to pretty much the entire continental [place] state. No international travel. Currently works cleaning  carpets. No pets currently. No bid exposure. No mold or hot tub exposure.    Social Drivers of Corporate investment banker Strain: Not on file  Food Insecurity: Not on file  Transportation Needs: Not on file  Physical Activity: Not on file  Stress: Not on file  Social Connections: Not on file    Outpatient Medications Prior to Visit  Medication Sig Dispense Refill   potassium chloride  SA (KLOR-CON  M) 20 MEQ tablet TAKE 1 TABLET BY MOUTH EVERY DAY 90 tablet 0   simvastatin  (ZOCOR ) 40 MG tablet TAKE 1 TABLET BY MOUTH EVERYDAY AT BEDTIME 90 tablet 0   triamterene -hydrochlorothiazide (MAXZIDE-25) 37.5-25 MG tablet TAKE 1 TABLET BY MOUTH EVERY DAY 90 tablet 1   No facility-administered medications prior to visit.     EXAM:  BP (!) 140/90 (BP Location: Left Arm, Patient Position: Sitting, Cuff Size: Large)   Pulse 67   Temp 97.8 F (36.6 C) (Oral)   Ht 5' 6 (1.676 m)   Wt 156 lb 3.2 oz (70.9 kg)   SpO2 97%   BMI 25.21 kg/m   Body mass index is 25.21 kg/m. Wt Readings from Last 3 Encounters:  02/19/24 156 lb 3.2 oz (70.9 kg)  07/18/23 160 lb 6.4 oz (72.8 kg)  09/03/22 162 lb 3.2 oz (73.6 kg)    Physical Exam: Vital signs reviewed HZW:Uypd is a well-developed well-nourished alert cooperative    who appearsr stated age in no acute distress.  HEENT: normocephalic atraumatic , Eyes: PERRL EOM's full, conjunctiva clear, Nares: paten,t no deformity discharge or tenderness., Ears: no deformity EAC's clear TMs with normal landmarks. Mouth: clear OP, no lesions, edema.  Moist mucous membranes. Dentition in adequate repair. NECK: supple without masses, thyromegaly or bruits. CHEST/PULM:  Clear to auscultation and percussion breath sounds equal no wheeze , rales or rhonchi. No chest wall deformities or tenderness. Breast: normal by inspection . No dimpling, discharge, masses, tenderness or discharge . CV: PMI is nondisplaced, S1 S2 no gallops, murmurs, rubs. Peripheral pulses are full  without delay.No JVD .  ABDOMEN: Bowel sounds normal nontender  No guard or rebound, no hepato splenomegal no CVA tenderness.  No hernia. Extremtities:  No clubbing cyanosis or edema, no acute joint swelling or redness no focal atrophy NEURO:  Oriented x3, cranial nerves 3-12 appear to be intact, no obvious focal weakness,gait within normal limits no abnormal reflexes or asymmetrical SKIN: No acute rashes normal turgor, color, no bruising or petechiae. PSYCH: Oriented, good eye contact, no obvious depression anxiety, cognition and judgment appear normal.  LN: no cervical axillary inguinal adenopathy  Lab Results  Component Value Date   WBC 3.8 (L) 08/02/2022   HGB 15.0 08/02/2022   HCT 44.6 08/02/2022   PLT 241.0 08/02/2022   GLUCOSE 81 08/02/2022   CHOL 158 08/02/2022   TRIG 55.0 08/02/2022   HDL 51.30 08/02/2022   LDLDIRECT 203.0 04/22/2007   LDLCALC 96 08/02/2022   ALT 26 08/02/2022   AST 24 08/02/2022   NA 142 08/02/2022   K 3.9 08/02/2022   CL 103 08/02/2022   CREATININE 1.11 08/02/2022   BUN 17 08/02/2022   CO2 30 08/02/2022   TSH 0.56 08/02/2022   PSA 1.02 08/02/2022   HGBA1C 5.9 02/05/2023    BP Readings from Last 3 Encounters:  02/19/24 (!) 140/90  07/18/23 110/62  09/03/22 128/82    Lab results reviewed with patient   ASSESSMENT AND PLAN:  Discussed the following assessment and plan:    ICD-10-CM   1. Visit for preventive health examination  Z00.00 Comprehensive metabolic panel with GFR    Lipid panel    PSA    TSH    Hemoglobin A1c    2. Essential hypertension  I10 Comprehensive metabolic panel with GFR    Lipid panel    TSH    Hemoglobin A1c    3. Medication management  Z79.899 Comprehensive metabolic panel with GFR    Lipid panel    TSH    Hemoglobin A1c    4. Screening PSA (prostate specific antigen)  Z12.5 Comprehensive metabolic panel with GFR    Lipid panel    PSA    TSH    Hemoglobin A1c    5. Hyperlipidemia, unspecified  hyperlipidemia type  E78.5 Comprehensive metabolic panel with GFR    Lipid panel    TSH    Hemoglobin A1c    6. Sarcoidosis  D86.9 Comprehensive metabolic panel with GFR    Lipid panel    TSH    Hemoglobin A1c   he should contact pulm about fu    7. Multiple thyroid  nodules  E04.2 US  THYROID    one nodule under surveillance    8. Perineal itching, male  L29.3    no rash suggest antifungal powder other ok to try fu if rash progresion etc    Declines vaccines today  Plan us  thyroid  Get bp readings consider adding amlodipine if needed for control  Have him get back with pulmonary  also  but he denies  new sx  Return in about 1 year (around 02/18/2025) for depending on results.  Patient Care Team: Charlett Apolinar POUR, MD as PCP - General Teressa Toribio SQUIBB, MD (Inactive) (Gastroenterology) Noreen Tonnie BRAVO, MD as Consulting Physician (Pulmonary Disease) Patient Instructions  Check Bp readings and send in  goal average 130/80 and below   We can add medication to help control  Call pulmonary team last seen dr Theophilus  last ct was 2023 .  Will order thyroid  ultrasound for fu thyroid  nodule .  You can  adjust timing as per your schedule   Lab today  If all ok then yearly check but we may want to fu BP earlier if not at goal .  Chozen Latulippe K. Markas Aldredge M.D.

## 2024-02-19 NOTE — Progress Notes (Signed)
 Lab results are in range   thyroid  psa kidneys are all in range No diabetes but  A1c is up from baseline 6.3  will follow

## 2024-02-20 ENCOUNTER — Ambulatory Visit
Admission: RE | Admit: 2024-02-20 | Discharge: 2024-02-20 | Disposition: A | Discharge status: Home / Self Care | Source: Ambulatory Visit | Attending: Internal Medicine | Admitting: Internal Medicine

## 2024-02-20 DIAGNOSIS — E042 Nontoxic multinodular goiter: Secondary | ICD-10-CM

## 2024-02-24 ENCOUNTER — Telehealth: Payer: Self-pay | Admitting: Internal Medicine

## 2024-02-24 DIAGNOSIS — I10 Essential (primary) hypertension: Secondary | ICD-10-CM

## 2024-02-24 NOTE — Telephone Encounter (Signed)
 Copied from CRM [id number]. Topic: Clinical - Medical Advice >> Feb 24, 2024  2:47 PM Tiffini S wrote: Reason for CRM: Patient called about blood pressure readings: Thursday 9/11- 7:15am 128/71, 11:45am 143/88, 7:30pm 119/77  Friday 9/12 9am 149/91, 3:55pm 132/86, Sat 9/13 8:30am 138/80, Sunday 9/14 6:30pm 150/81, Monday 9/15 7:30am 137/84   Please call the patient at [phone]

## 2024-02-25 NOTE — Telephone Encounter (Signed)
 Copied from CRM [id number]. Topic: General - Other >> Feb 25, 2024 12:30 PM Mercedes MATSU wrote: Reason for CRM: Patient is calling to ask for reccomendations and states that he called yesterday and  no one has returned his call.   Blood pressure readings:   Thursday 9/11- 7:15am 128/71, 11:45am 143/88, 7:30pm 119/77    Friday 9/12 9am 149/91, 3:55pm 132/86  Sat 9/13 8:30am 138/80  Sunday 9/14 6:30pm 150/81  Monday 9/15 7:30am 137/84.   Please call the patient at [phone].

## 2024-02-25 NOTE — Telephone Encounter (Signed)
 Thanks for sending on these results  Lets add  amlodipine  2.5 mg per day  disp 90 refill x 1  this is a low dose and may have to increase to 5 mg the standard dose  stay on current medications  After  1-2 months send in another  3-5 days of bp readings  Ane we can decide on follow up. SABRA

## 2024-02-26 MED ORDER — AMLODIPINE BESYLATE 2.5 MG PO TABS: 2.5000 mg | ORAL_TABLET | Freq: Every day | ORAL | 1 refills | Status: AC

## 2024-02-26 NOTE — Telephone Encounter (Signed)
Attempted to reach pt.  Voicemail is full.

## 2024-02-26 NOTE — Telephone Encounter (Signed)
 May take  weeks to get maximum effect so  maybe in 2-4 weeks  earlier any time

## 2024-03-02 NOTE — Progress Notes (Signed)
 Ultrasound  shows no new nodules of concern   the  previously biopsied  nodule shows slight increase in measured size.  This may be fine and advise continue to monitor . However I am not sure if this is significant .  If you agree would like to  have endocrinology opinion . (K please place endocrinology referral ,consult  dx  thyroid  nodules )

## 2024-03-02 NOTE — Telephone Encounter (Signed)
See result note  comments

## 2024-03-03 ENCOUNTER — Telehealth: Payer: Self-pay | Admitting: Internal Medicine

## 2024-03-03 NOTE — Telephone Encounter (Signed)
 Copied from CRM #[id number]. Topic: Referral - Question >> Mar 03, 2024  4:06 PM Taleah C wrote: Reason for CRM: pt called in and stated that he was supposed to get a referral to see an oncologist. I did not see a referral placed. Please call and advise with pt on where it was sent too.

## 2024-03-09 NOTE — Telephone Encounter (Signed)
 A referral to endocrinology was placed today and pt was contacted. Please image result note.

## 2024-03-17 ENCOUNTER — Ambulatory Visit: Admitting: Pulmonary Disease

## 2024-03-17 ENCOUNTER — Encounter: Payer: Self-pay | Admitting: Pulmonary Disease

## 2024-03-17 ENCOUNTER — Ambulatory Visit

## 2024-03-17 VITALS — BP 120/80 | HR 70 | Temp 98.3°F | Ht 66.0 in | Wt 157.0 lb

## 2024-03-17 DIAGNOSIS — D869 Sarcoidosis, unspecified: Secondary | ICD-10-CM | POA: Diagnosis not present

## 2024-03-17 NOTE — Patient Instructions (Addendum)
  VISIT SUMMARY: You came in for a routine check-up to monitor your sarcoidosis and overall health. We discussed your current condition and planned some follow-up tests to ensure everything remains stable.  YOUR PLAN: SARCOIDOSIS: Your sarcoidosis, diagnosed in 2007, is currently inactive. You have no recent symptoms and your last chest CT in 2023 showed some nodularity but no significant issues. -Order a chest x-ray to monitor your lungs. -Order a lung function test to check your breathing. -Review the lung function test results and communicate the findings to you via MyChart or phone. -Consider a CT scan if the lung function test shows any abnormalities. Right x-ray

## 2024-03-17 NOTE — Progress Notes (Signed)
 Tyler Barrera    [id number]    10/05/1960  Primary Care Physician:Panosh, Apolinar POUR, MD  Referring Physician: Charlett Apolinar POUR, MD [address] [place],  [place] [address]  Chief complaint: Follow-up for sarcoidosis  HPI: 63 y.o. with history of sarcoidosis Diagnosed with transbronchial lung biopsy in 2007.  He was briefly on prednisone  after diagnosis.  Has not required any treatment after that with no symptoms Denies any dyspnea on exertion, cough.  He has congestion during the winter months but otherwise okay the rest of the year.  Previously followed by Dr. Noreen.  Lost to follow-up since 2018 Is been referred back by his primary care to reestablish care  Interim history: Discussed the use of AI scribe software for clinical note transcription with the patient, who gave verbal consent to proceed.  History of Present Illness Tyler Barrera is a 63 year old male with sarcoidosis who presents for routine monitoring. He was referred by Dr. Charlett for monitoring as it had not been done recently.  Pulmonary symptoms and sarcoidosis monitoring - Sarcoidosis diagnosed in 2007 - Previously treated with prednisone  - No current cough - Last chest CT in 2023 showed some nodularity  Functional status - Engages in regular physical activity through work and yoga - Maintains good physical fitness   Relevant pulmonary history Pets: No pets Occupation: Works in a [employer]or company Exposures: No mold, hot tub, Jacuzzi.  No feather pillows or comforters Smoking history: Never smoker Travel history: No significant travel history Relevant family history: No family history of lung disease.  Outpatient Encounter Medications as of 03/17/2024  Medication Sig   amLODipine  (NORVASC ) 2.5 MG tablet Take 1 tablet (2.5 mg total) by mouth daily.   potassium chloride  SA (KLOR-CON  M) 20 MEQ tablet Take 1 tablet (20 mEq total) by mouth daily.   simvastatin  (ZOCOR ) 40  MG tablet TAKE 1 TABLET BY MOUTH EVERYDAY AT BEDTIME   triamterene -hydrochlorothiazide (MAXZIDE-25) 37.5-25 MG tablet Take 1 tablet by mouth daily.   No facility-administered encounter medications on file as of 03/17/2024.   Vitals:   03/17/24 1135  BP: 120/80  Pulse: 70  Temp: 98.3 F (36.8 C)  Height: 5' 6 (1.676 m)  Weight: 157 lb (71.2 kg)  SpO2: 99%  TempSrc: Oral  BMI (Calculated): 25.35     Physical Exam GEN: No acute distress CV: Regular rate and rhythm no murmurs LUNGS: Clear to auscultation bilaterally normal respiratory effort SKIN JOINTS: Warm and dry no rash    Data Reviewed: Imaging: CT chest 09/07/2005-mediastinal, hilar lymphadenopathy with upper lobe pulmonary nodules. Chest x-ray 01/30/2018-stable scarring in the upper lobes High resolution CT chest mild scattered perilymphatic nodularity in the upper lobes with improvement compared to prior.  Mild air trapping.  Thyroid  nodule. I have reviewed the images personally.  PFTs: 03/14/2017 FVC 2.87 [82%], FEV1 2.65 [96%], F/F 92, TLC 4.40 [71%], DLCO 21.74 [80%] Mild restriction  03/15/2021 FVC 2.80 [81%], FEV1 2.60 [97%], F/F93, TLC 4.62 [74%], DLCO 21.20 [87%] Minimal restriction   Labs: Comprehensive metabolic panel 03/23/2020-within normal limits  09/19/05-lung biopsy pathology The sections show fragments of benign alveolar and bronchial   tissues. 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 No necrosis is identified. The features are most   suggestive of sarcoidosis.  Assessment & Plan Sarcoidosis, currently inactive Sarcoidosis diagnosed in 2007, currently inactive. No recent symptoms or need for prednisone .  Last CT in 2023 showed improved nodularity. Breathing well-managed.  - Order chest x-ray - Order lung function test - Review lung function test results and communicate via MyChart or phone - Consider CT if lung function  test is abnormal  Plan/Recommendations: PFTs, chest x-ray  I personally spent a total of 40 minutes in the care of the patient today including preparing to see the patient, getting/reviewing separately obtained history, performing a medically appropriate exam/evaluation, counseling and educating, placing orders, and communicating results.   Viriginia Amendola MD [facility] 03/17/2024, 11:13 AM  CC: Panosh, Apolinar POUR, MD

## 2024-03-20 ENCOUNTER — Ambulatory Visit: Payer: Self-pay | Admitting: Pulmonary Disease

## 2024-03-31 ENCOUNTER — Ambulatory Visit (INDEPENDENT_AMBULATORY_CARE_PROVIDER_SITE_OTHER): Admitting: *Deleted

## 2024-03-31 DIAGNOSIS — D869 Sarcoidosis, unspecified: Secondary | ICD-10-CM

## 2024-03-31 LAB — PULMONARY FUNCTION TEST
DL/VA % pred: 121 %
DL/VA: 5.15 ml/min/mmHg/L
DLCO cor % pred: 92 %
DLCO cor: 21.88 ml/min/mmHg
DLCO unc % pred: 92 %
DLCO unc: 21.88 ml/min/mmHg
FEF 25-75 Post: 3.59 L/s
FEF 25-75 Pre: 3.31 L/s
FEF2575-%Change-Post: 8 %
FEF2575-%Pred-Post: 148 %
FEF2575-%Pred-Pre: 137 %
FEV1-%Change-Post: 0 %
FEV1-%Pred-Post: 81 %
FEV1-%Pred-Pre: 82 %
FEV1-Post: 2.43 L
FEV1-Pre: 2.45 L
FEV1FVC-%Change-Post: 2 %
FEV1FVC-%Pred-Pre: 115 %
FEV6-%Change-Post: -2 %
FEV6-%Pred-Post: 72 %
FEV6-%Pred-Pre: 75 %
FEV6-Post: 2.75 L
FEV6-Pre: 2.83 L
FEV6FVC-%Pred-Post: 105 %
FEV6FVC-%Pred-Pre: 105 %
FVC-%Change-Post: -2 %
FVC-%Pred-Post: 68 %
FVC-%Pred-Pre: 71 %
FVC-Post: 2.75 L
FVC-Pre: 2.83 L
Post FEV1/FVC ratio: 88 %
Post FEV6/FVC ratio: 100 %
Pre FEV1/FVC ratio: 87 %
Pre FEV6/FVC Ratio: 100 %
RV % pred: 76 %
RV: 1.61 L
TLC % pred: 71 %
TLC: 4.45 L

## 2024-03-31 NOTE — Progress Notes (Signed)
 Full PFT performed today.

## 2024-03-31 NOTE — Patient Instructions (Signed)
 Full PFT performed today.

## 2024-04-24 ENCOUNTER — Telehealth: Payer: Self-pay

## 2024-04-24 NOTE — Telephone Encounter (Signed)
 Copied from CRM [id number]. Topic: Clinical - Lab/Test Results >> Apr 24, 2024 12:28 PM Leila BROCKS wrote: Reason for CRM: Patient [phone] states he missed an appointment with Dr. Theophilus and wants to reschedule, patient thinks it's for PFT results. Unable to reach CAL. Patient wants clarification if an office visit is needed for PFT results or someone will discuss his results over the phone. Please advise and call back.    ----------------------------------------------------------------------- From previous Reason for Contact - Scheduling Inquiry for Clinic: Reason for CRM:    ----------------------------------------------------------------------- From previous Reason for Contact - Scheduling: Patient/patient representative is calling to schedule an appointment. Refer to attachments for appointment information.

## 2024-04-24 NOTE — Telephone Encounter (Signed)
 Copied from CRM [id number]. Topic: Clinical - Lab/Test Results >> Apr 24, 2024 12:28 PM Leila BROCKS wrote: Reason for CRM: Patient [phone] states he missed an appointment with Dr. Theophilus and wants to reschedule, patient thinks it's for PFT results. Unable to reach CAL. Patient wants clarification if an office visit is needed for PFT results or someone will discuss his results over the phone. Please advise and call back.    ----------------------------------------------------------------------- From previous Reason for Contact - Scheduling Inquiry for Clinic: Reason for CRM:    ----------------------------------------------------------------------- From previous Reason for Contact - Scheduling: Patient/patient representative is calling to schedule an appointment. Refer to attachments for appointment information.      VM /LM - okay per DPR -   f/u is in one year - per provider Review the lung function test results and communicate the findings to you via MyChart or phone

## 2024-06-05 ENCOUNTER — Encounter: Payer: Self-pay | Admitting: Family Medicine

## 2024-06-05 ENCOUNTER — Ambulatory Visit: Admitting: Family Medicine

## 2024-06-05 VITALS — BP 130/74 | HR 94 | Temp 99.9°F | Ht 66.0 in | Wt 159.2 lb

## 2024-06-05 DIAGNOSIS — B349 Viral infection, unspecified: Secondary | ICD-10-CM

## 2024-06-05 DIAGNOSIS — H6122 Impacted cerumen, left ear: Secondary | ICD-10-CM

## 2024-06-05 DIAGNOSIS — J4520 Mild intermittent asthma, uncomplicated: Secondary | ICD-10-CM | POA: Diagnosis not present

## 2024-06-05 LAB — POC INFLUENZA A&B (BINAX/QUICKVUE)
Influenza A, POC: NEGATIVE
Influenza B, POC: NEGATIVE

## 2024-06-05 LAB — POC COVID19 BINAXNOW: SARS Coronavirus 2 Ag: NEGATIVE

## 2024-06-05 MED ORDER — EAR WAX CLEANSING 6.5 % OT KIT: PACK | OTIC | 1 refills | Status: AC

## 2024-06-05 MED ORDER — PROMETHAZINE-DM 6.25-15 MG/5ML PO SYRP: 5.0000 mL | ORAL_SOLUTION | Freq: Four times a day (QID) | ORAL | 0 refills | Status: AC | PRN

## 2024-06-05 MED ORDER — ALBUTEROL SULFATE HFA 108 (90 BASE) MCG/ACT IN AERS: 1.0000 | INHALATION_SPRAY | Freq: Four times a day (QID) | RESPIRATORY_TRACT | 2 refills | Status: AC | PRN

## 2024-06-05 NOTE — Progress Notes (Signed)
 "  Established Patient Office Visit   Subjective:  Patient ID: Tyler Barrera, male    DOB: 1960/08/09  Age: 63 y.o. MRN: [id number]  Chief Complaint  Patient presents with   Acute Visit    Pt presents today with nasal congestion and cough. Pt states he has been having these symptoms the past 2 days. Pt c/o sneezing runny nose, headaches and chills     HPI Encounter Diagnoses  Name Primary?   Viral syndrome Yes   Mild intermittent reactive airway disease without complication    Excessive cerumen in left ear canal    2 to 3-day history of URI symptoms to include nasal congestion postnasal drip, headache, cough, mild wheezing.  Hot and cold spells.  Generalized bodyaches.  Malaise.  History of sarcoidosis in distant past.  No asthma history.  Chest x-ray results from October reviewed.   Review of Systems  Constitutional: Negative.   HENT: Negative.    Eyes:  Negative for blurred vision, discharge and redness.  Respiratory:  Positive for cough and wheezing. Negative for sputum production and shortness of breath.   Cardiovascular: Negative.   Gastrointestinal:  Negative for abdominal pain.  Genitourinary: Negative.   Musculoskeletal:  Positive for myalgias. Negative for joint pain.  Skin:  Negative for rash.  Neurological:  Positive for headaches. Negative for tingling, loss of consciousness and weakness.  Endo/Heme/Allergies:  Negative for polydipsia.    Current Medications[1]   Objective:     BP 130/74   Pulse 94   Temp 99.9 F (37.7 C)   Ht 5' 6 (1.676 m)   Wt 159 lb 3.2 oz (72.2 kg)   SpO2 100%   BMI 25.70 kg/m    Physical Exam Constitutional:      General: He is not in acute distress.    Appearance: Normal appearance. He is not ill-appearing, toxic-appearing or diaphoretic.  HENT:     Head: Normocephalic and atraumatic.     Right Ear: External ear normal.     Left Ear: External ear normal. There is impacted cerumen.     Mouth/Throat:     Mouth: Mucous  membranes are moist.     Pharynx: Oropharynx is clear. No oropharyngeal exudate or posterior oropharyngeal erythema.  Eyes:     General: No scleral icterus.       Right eye: No discharge.        Left eye: No discharge.     Extraocular Movements: Extraocular movements intact.     Conjunctiva/sclera: Conjunctivae normal.     Pupils: Pupils are equal, round, and reactive to light.  Cardiovascular:     Rate and Rhythm: Normal rate and regular rhythm.  Pulmonary:     Effort: Pulmonary effort is normal. No respiratory distress.     Breath sounds: Decreased breath sounds present. No wheezing, rhonchi or rales.  Abdominal:     General: Bowel sounds are normal.     Tenderness: There is no abdominal tenderness. There is no guarding.  Musculoskeletal:     Cervical back: No rigidity or tenderness.  Skin:    General: Skin is warm and dry.  Neurological:     Mental Status: He is alert and oriented to person, place, and time.  Psychiatric:        Mood and Affect: Mood normal.        Behavior: Behavior normal.      No results found for any visits on 06/05/24.    The 10-year ASCVD risk score (Arnett DK,  et al., 2019) is: 26.4%    Assessment & Plan:   Viral syndrome -     Promethazine -DM; Take 5 mLs by mouth 4 (four) times daily as needed.  Dispense: 118 mL; Refill: 0  Mild intermittent reactive airway disease without complication -     Albuterol  Sulfate HFA; Inhale 1 puff into the lungs every 6 (six) hours as needed for wheezing or shortness of breath.  Dispense: 8 g; Refill: 2  Excessive cerumen in left ear canal -     Ear Wax Cleansing; Follow instructions on kit  Dispense: 1 kit; Refill: 1    Return in about 1 week (around 06/12/2024), or if symptoms worsen or fail to improve.  Rest fluids and use above medications as needed.  Follow-up in 1 week if not improving.  Flue and COVID testing were negative.  Advised flu shot when he is feeling better.  Elsie Sim Lent, MD     [1]  Current Outpatient Medications:    amLODipine  (NORVASC ) 2.5 MG tablet, Take 1 tablet (2.5 mg total) by mouth daily., Disp: 90 tablet, Rfl: 1   potassium chloride  SA (KLOR-CON  M) 20 MEQ tablet, Take 1 tablet (20 mEq total) by mouth daily., Disp: 90 tablet, Rfl: 1   promethazine -dextromethorphan (PROMETHAZINE -DM) 6.25-15 MG/5ML syrup, Take 5 mLs by mouth 4 (four) times daily as needed., Disp: 118 mL, Rfl: 0   simvastatin  (ZOCOR ) 40 MG tablet, TAKE 1 TABLET BY MOUTH EVERYDAY AT BEDTIME, Disp: 90 tablet, Rfl: 3   triamterene -hydrochlorothiazide (MAXZIDE-25) 37.5-25 MG tablet, Take 1 tablet by mouth daily., Disp: 90 tablet, Rfl: 1   albuterol  (VENTOLIN  HFA) 108 (90 Base) MCG/ACT inhaler, Inhale 1 puff into the lungs every 6 (six) hours as needed for wheezing or shortness of breath., Disp: 8 g, Rfl: 2   Carbamide Peroxide-Saline (EAR WAX CLEANSING) 6.5 % KIT, Follow instructions on kit, Disp: 1 kit, Rfl: 1  "

## 2024-06-05 NOTE — Addendum Note (Signed)
 Addended by: ROLLENE CANE A on: 06/05/2024 12:18 PM   Modules accepted: Orders

## 2024-09-02 ENCOUNTER — Ambulatory Visit: Admitting: Endocrinology
